# Patient Record
Sex: Female | Born: 1980 | Race: Black or African American | Hispanic: No | Marital: Single | Smoking: Never smoker
Health system: Southern US, Community
[De-identification: ages and names within clinical notes are randomized; demographics above are authoritative.]

## PROBLEM LIST (undated history)

## (undated) DIAGNOSIS — F4310 Post-traumatic stress disorder, unspecified: Secondary | ICD-10-CM

## (undated) DIAGNOSIS — F419 Anxiety disorder, unspecified: Secondary | ICD-10-CM

## (undated) DIAGNOSIS — I10 Essential (primary) hypertension: Secondary | ICD-10-CM

## (undated) DIAGNOSIS — F410 Panic disorder [episodic paroxysmal anxiety] without agoraphobia: Secondary | ICD-10-CM

## (undated) HISTORY — PX: TUBAL LIGATION: SHX77

---

## 2003-02-07 ENCOUNTER — Inpatient Hospital Stay (HOSPITAL_COMMUNITY): Admission: AD | Admit: 2003-02-07 | Discharge: 2003-02-07 | Payer: Self-pay | Admitting: Obstetrics and Gynecology

## 2003-02-18 ENCOUNTER — Inpatient Hospital Stay (HOSPITAL_COMMUNITY): Admission: AD | Admit: 2003-02-18 | Discharge: 2003-02-18 | Payer: Self-pay | Admitting: *Deleted

## 2004-07-21 ENCOUNTER — Inpatient Hospital Stay (HOSPITAL_COMMUNITY): Admission: AD | Admit: 2004-07-21 | Discharge: 2004-07-21 | Payer: Self-pay | Admitting: Family Medicine

## 2004-07-30 ENCOUNTER — Emergency Department (HOSPITAL_COMMUNITY): Admission: EM | Admit: 2004-07-30 | Discharge: 2004-07-30 | Payer: Self-pay | Admitting: Emergency Medicine

## 2004-12-31 ENCOUNTER — Inpatient Hospital Stay (HOSPITAL_COMMUNITY): Admission: AD | Admit: 2004-12-31 | Discharge: 2004-12-31 | Payer: Self-pay | Admitting: *Deleted

## 2006-03-19 ENCOUNTER — Inpatient Hospital Stay (HOSPITAL_COMMUNITY): Admission: AD | Admit: 2006-03-19 | Discharge: 2006-03-19 | Payer: Self-pay | Admitting: Obstetrics and Gynecology

## 2006-03-31 ENCOUNTER — Inpatient Hospital Stay (HOSPITAL_COMMUNITY): Admission: AD | Admit: 2006-03-31 | Discharge: 2006-03-31 | Payer: Self-pay | Admitting: Obstetrics and Gynecology

## 2006-07-30 ENCOUNTER — Inpatient Hospital Stay (HOSPITAL_COMMUNITY): Admission: AD | Admit: 2006-07-30 | Discharge: 2006-07-31 | Payer: Self-pay | Admitting: Family Medicine

## 2006-08-02 ENCOUNTER — Inpatient Hospital Stay (HOSPITAL_COMMUNITY): Admission: AD | Admit: 2006-08-02 | Discharge: 2006-08-02 | Payer: Self-pay | Admitting: Obstetrics and Gynecology

## 2006-08-06 ENCOUNTER — Inpatient Hospital Stay (HOSPITAL_COMMUNITY): Admission: AD | Admit: 2006-08-06 | Discharge: 2006-08-06 | Payer: Self-pay | Admitting: Obstetrics and Gynecology

## 2007-03-26 ENCOUNTER — Inpatient Hospital Stay (HOSPITAL_COMMUNITY): Admission: RE | Admit: 2007-03-26 | Discharge: 2007-03-29 | Payer: Self-pay | Admitting: Obstetrics and Gynecology

## 2007-08-17 IMAGING — US US OB TRANSVAGINAL
1 series · 13 of 28 positions shown · non-contrast
Comparison: 07/30/2006

CLINICAL DATA: Early pregnancy. Viability.

TRANSABDOMINAL AND TRANSVAGINAL PELVIC ULTRASOUND
TECHNIQUE: Both transabdominal and transvaginal ultrasound examinations of the
pelvis were performed including evaluation of the uterus, ovaries, adnexal
regions, and pelvic cul-de-sac.

[Series 1: us ob transvaginal · 0.20mm/px · 29 acquisitions, 13 frames shown]
[im 2/29]
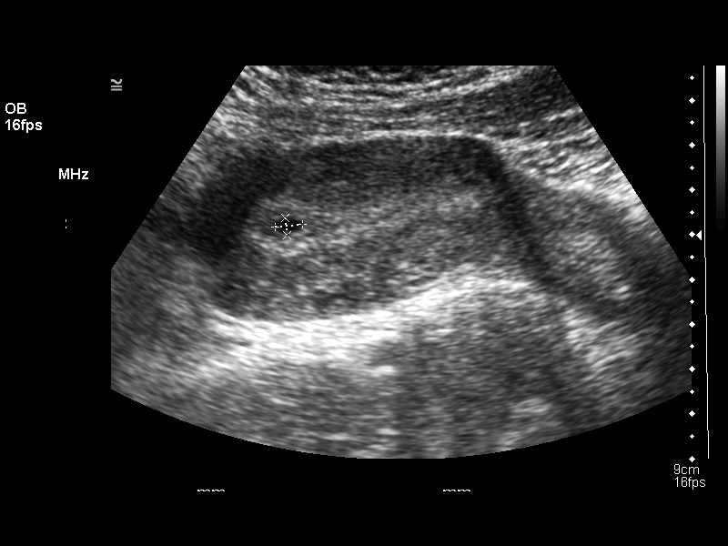
[im 4/29]
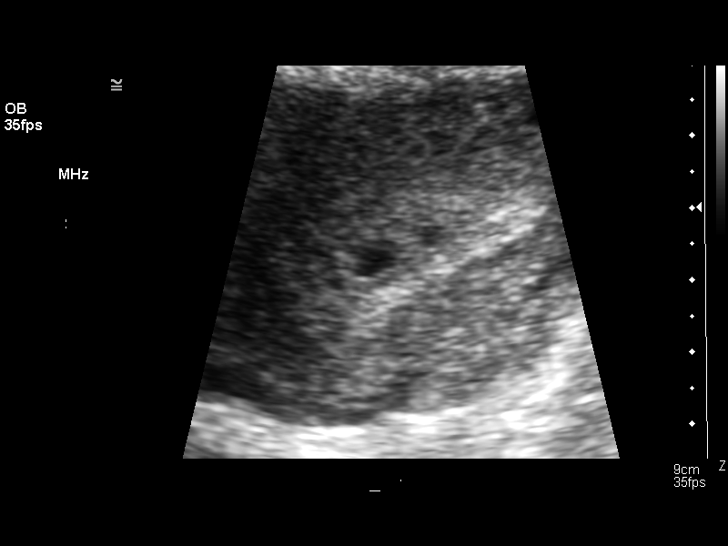
[im 6/29]
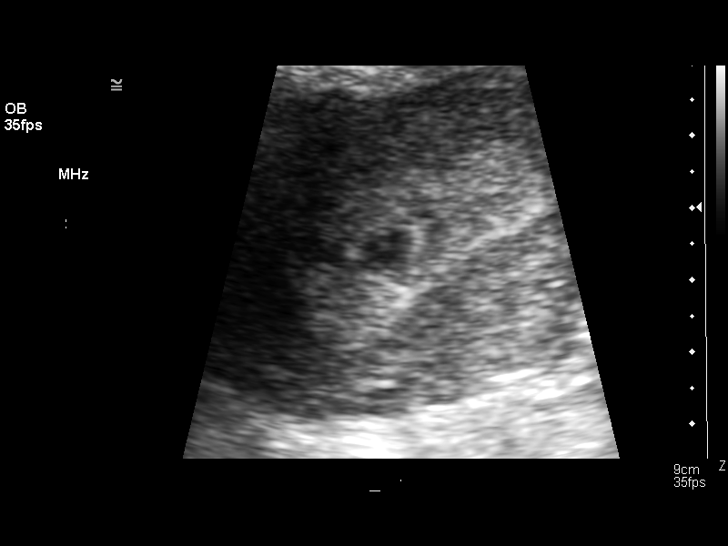
[im 8/29]
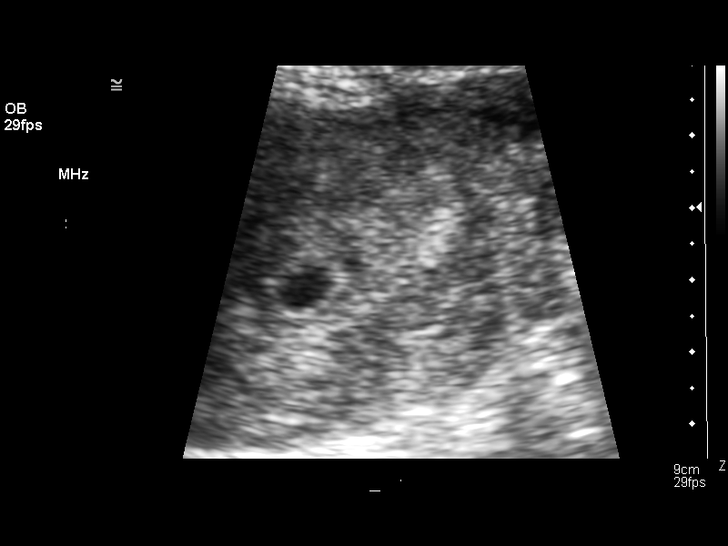
[im 10/29]
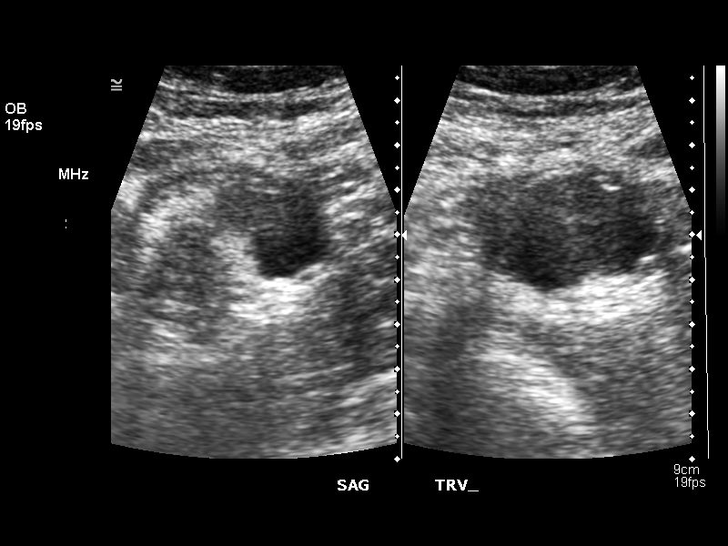
[im 12/29]
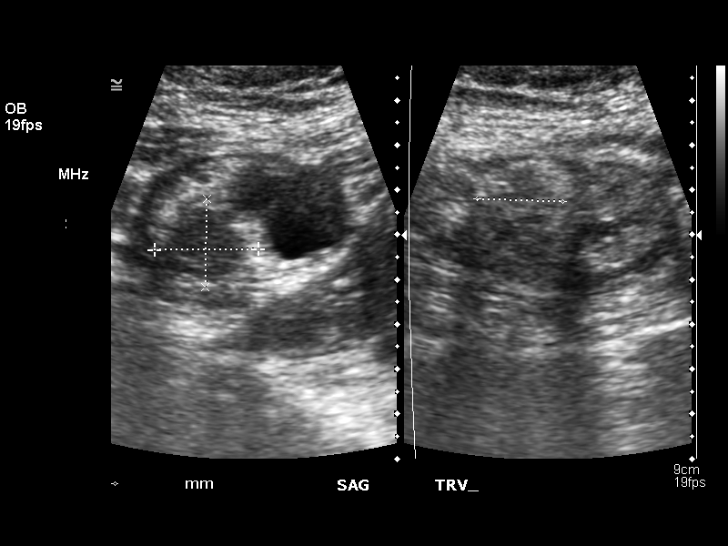
[im 15/29]
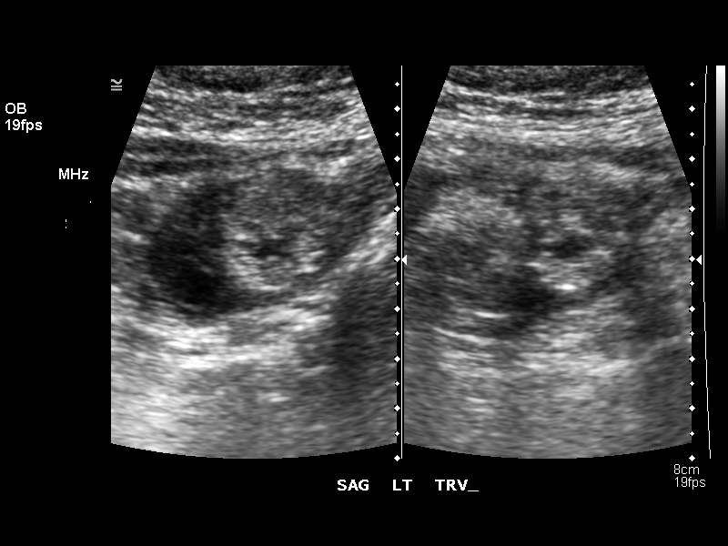
[im 17/29]
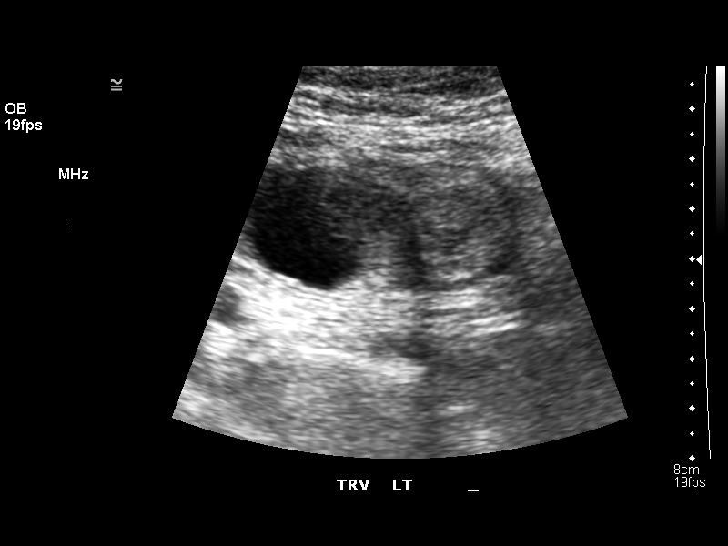
[im 19/29]
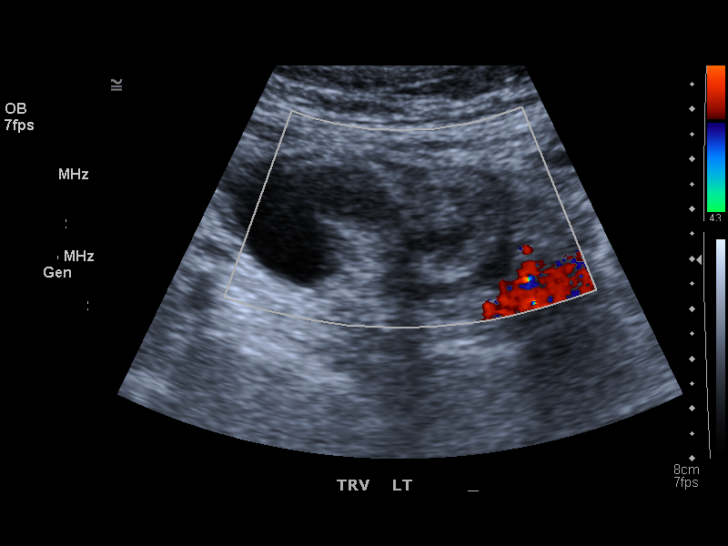
[im 21/29]
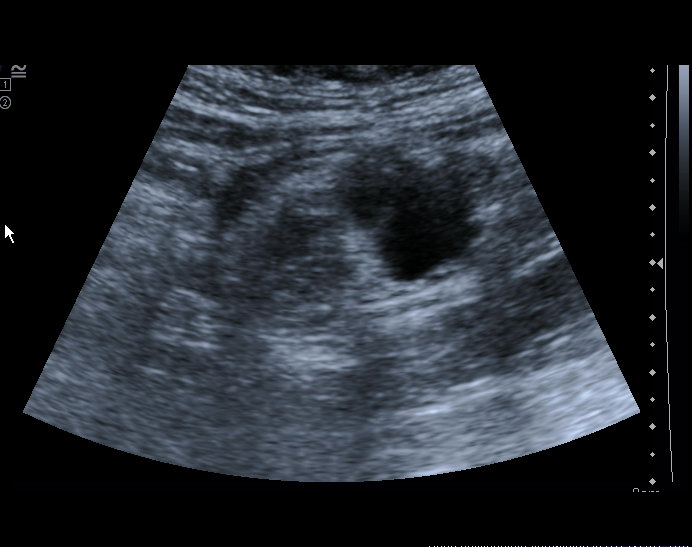
[im 23/29]
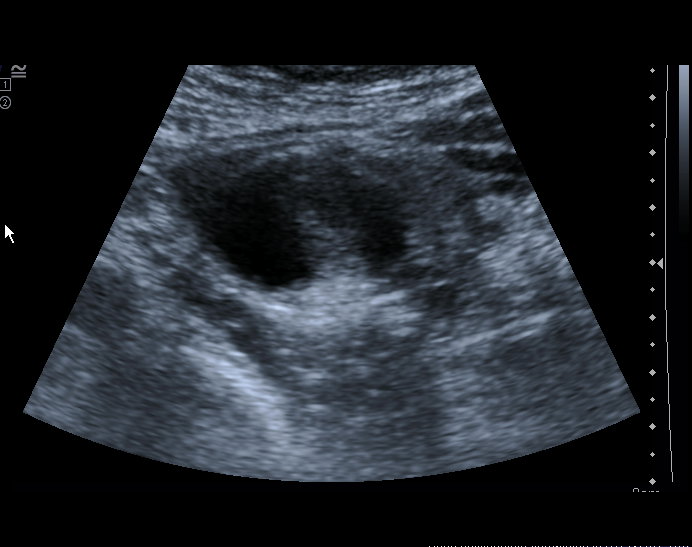
[im 25/29]
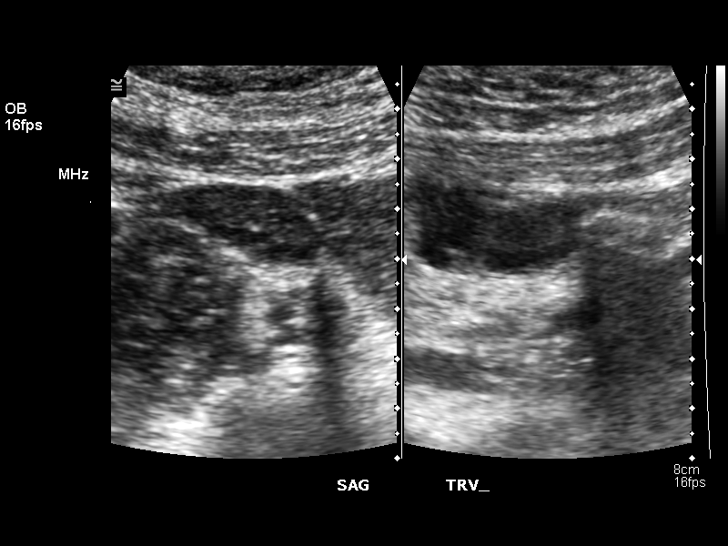
[im 27/29]
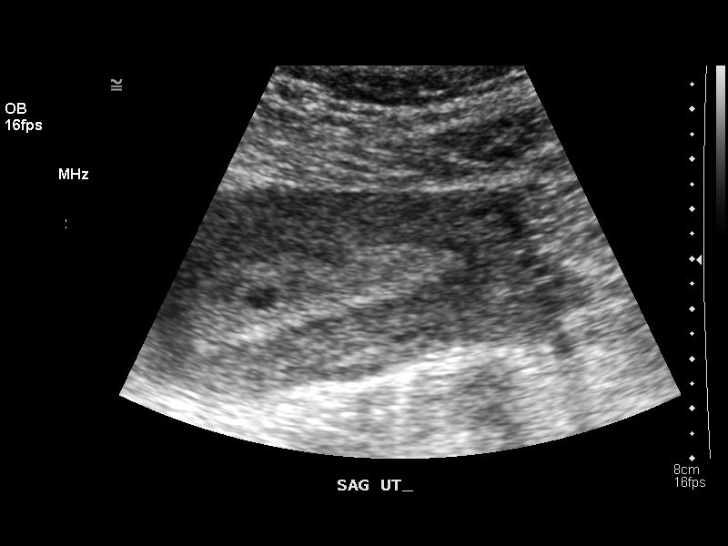

[13 of 28 positions shown; findings below may reference images not displayed]

FINDINGS: Initially transvaginal imaging was employed. This demonstrated a faint
suggestion of a gestational sac in the uterus, but the orientation of the uterus
is unfavorable for transvaginal imaging.

Subsequently, transabdominal imaging was employed. Within the uterine fundal
endometrium there is a 6.1 x 3.9 x 6.2 mm focus of fluid echogenicity, which
would correspond to a 5 week 2 day gestation. However, no embryo or yolk sac is
yet visible. The endometrial stripe appears essentially echogenic, without
definitive double decidual reaction. Thus we cannot be certain that this does
not represent a pseudogestational sac. 

The left ovary measures 4.4 x 4.2 x 3.0 centimeters and contains a 2.1 cm
echogenic region with central hypoechogenicity along its margin. This could
possibly represent a corpus luteum, but could also possibly represent an
echogenic ring of an ectopic pregnancy immediately adjacent to the ovary.  We do
not demonstrate adjacent free pelvic fluid. There is also a small peripheral
ovarian cyst in the left ovary. No definite shadowing calcification is
identified to suggest a dermoid cyst.

The right ovary continues to appear unremarkable, measuring 1.8 x 2.1 x 1.5 cm.
No free pelvic fluid detected.

IMPRESSION

1. There does appear to be a small sac within the uterus, which based on size
would correspond to a 5 week 2 day gestation. However, no yolk sac or embryo is
yet seen, and I cannot definitively exclude a pseudogestational sac,
particularly in light of the echogenic ringlike structure along the left ovary.
This structure may simply represent a corpus luteum, but ectopic pregnancy has
not been definitively excluded. The lack of free pelvic fluid is somewhat
reassuring. I do recommend careful continued followup of quantitative beta-hCG
levels and careful followup imaging to ensure expected intrauterine pregnancy
development.

## 2017-12-05 ENCOUNTER — Other Ambulatory Visit (HOSPITAL_BASED_OUTPATIENT_CLINIC_OR_DEPARTMENT_OTHER): Payer: Self-pay | Admitting: Physician Assistant

## 2017-12-05 DIAGNOSIS — Z1239 Encounter for other screening for malignant neoplasm of breast: Secondary | ICD-10-CM

## 2018-01-09 ENCOUNTER — Other Ambulatory Visit: Payer: Self-pay

## 2018-01-09 ENCOUNTER — Emergency Department (HOSPITAL_BASED_OUTPATIENT_CLINIC_OR_DEPARTMENT_OTHER)
Admission: EM | Admit: 2018-01-09 | Discharge: 2018-01-09 | Disposition: A | Discharge status: Home / Self Care | Payer: Medicaid Other | Attending: Emergency Medicine | Admitting: Emergency Medicine

## 2018-01-09 ENCOUNTER — Encounter (HOSPITAL_BASED_OUTPATIENT_CLINIC_OR_DEPARTMENT_OTHER): Payer: Self-pay | Admitting: Emergency Medicine

## 2018-01-09 DIAGNOSIS — I10 Essential (primary) hypertension: Secondary | ICD-10-CM | POA: Insufficient documentation

## 2018-01-09 DIAGNOSIS — F419 Anxiety disorder, unspecified: Secondary | ICD-10-CM | POA: Diagnosis not present

## 2018-01-09 DIAGNOSIS — Z760 Encounter for issue of repeat prescription: Secondary | ICD-10-CM | POA: Diagnosis present

## 2018-01-09 HISTORY — DX: Essential (primary) hypertension: I10

## 2018-01-09 HISTORY — DX: Anxiety disorder, unspecified: F41.9

## 2018-01-09 MED ORDER — CLONAZEPAM 1 MG PO TABS: 0.5000 mg | ORAL_TABLET | Freq: Two times a day (BID) | ORAL | 0 refills | Status: DC | PRN

## 2018-01-09 NOTE — ED Notes (Signed)
Patient stated that she run out of her prescription and she will be seeing Dr. Julio Sickssei-Bonsu on Monday.  She is concerned because MD wants her to see him every time she needs a refill and she has to pay $35.  While she was in [place], her PCP calls her prescription to the pharmacy without her paying extra money for a visit.

## 2018-01-09 NOTE — ED Provider Notes (Signed)
[facility] EMERGENCY DEPARTMENT Provider Note   CSN: [id number] Arrival date & time: 01/09/18  1258     History   Chief Complaint Chief Complaint  Patient presents with  . Medication Refill    HPI Sharon Hammond is a 37 y.o. female past medical history anxiety and hypertension who presents emergency department today for medication refill.  Patient reports that she takes Klonopin for her anxiety.  Patient reports that she has a primary care doctor who has been prescribing this for her.  Patient reports that she ran out of her medications and called her primary care doctor to get the prescription filled via phone but states that she had to make an appointment.  Patient reports that due to work she was not able to get into see him before she ran out.  Patient comes today because she ran out of her medication last night.  Patient denies any fever, chest pain, difficulty breathing, abdominal pain, nausea/vomiting.  The history is provided by the patient.    Past Medical History:  Diagnosis Date  . Anxiety   . Hypertension     There are no active problems to display for this patient.   Past Surgical History:  Procedure Laterality Date  . TUBAL LIGATION      OB History    No data available       Home Medications    Prior to Admission medications   Medication Sig Start Date End Date Taking? Authorizing Provider  hydrochlorothiazide (MICROZIDE) 12.5 MG capsule Take 12.5 mg by mouth daily.   Yes [provider]  clonazePAM (KLONOPIN) 1 MG tablet Take 0.5 tablets (0.5 mg total) by mouth 2 (two) times daily as needed for up to 3 days for anxiety. 01/09/18 01/12/18  Maxwell CaulLayden, Shanika Levings A, PA-C    Family History No family history on file.  Social History Social History   Tobacco Use  . Smoking status: Never Smoker  . Smokeless tobacco: Never Used  Substance Use Topics  . Alcohol use: Not on file  . Drug use: Not on file     Allergies    Codeine   Review of Systems Review of Systems  Respiratory: Negative for shortness of breath.   Cardiovascular: Negative for chest pain.  Gastrointestinal: Negative for abdominal pain, nausea and vomiting.     Physical Exam Updated Vital Signs BP 133/81 (BP Location: Left Arm)   Pulse 88   Temp 98.3 F (36.8 C) (Oral)   Resp 20   LMP 01/03/2018   SpO2 100%   Physical Exam  Constitutional: She appears well-developed and well-nourished.  HENT:  Head: Normocephalic and atraumatic.  Eyes: Conjunctivae and EOM are normal. Right eye exhibits no discharge. Left eye exhibits no discharge. No scleral icterus.  Cardiovascular: Normal rate and regular rhythm.  Pulmonary/Chest: Effort normal and breath sounds normal.  Neurological: She is alert.  Skin: Skin is warm and dry.  Psychiatric: She has a normal mood and affect. Her speech is normal and behavior is normal.  Nursing note and vitals reviewed.    ED Treatments / Results  Labs (all labs ordered are listed, but only abnormal results are displayed) Labs Reviewed - No data to display  EKG  EKG Interpretation None       Radiology No results found.  Procedures Procedures (including critical care time)  Medications Ordered in ED Medications - No data to display   Initial Impression / Assessment and Plan / ED Course  I have  reviewed the triage vital signs and the nursing notes.  Pertinent labs & imaging results that were available during my care of the patient were reviewed by me and considered in my medical decision making (see chart for details).     37 year old female who presents today for evaluation of medication refill.  Patient takes Klonopin that she is prescribed by her primary care doctor.  Ran out and attempted to have a primary care doctor call in prescription but wanted her to make an appointment.  Patient was unable to see them due to work.  Comes today because she ran out of medications.  No  complaints at this time. Patient is afebrile, non-toxic appearing, sitting comfortably on examination table. Vital signs reviewed and stable.  Patient reviewed on PMP.  She had a Klonopin prescription written on 12/25/17 for 30 Klonopin pills.  Otherwise no other recent Klonopin prescriptions.  I discussed with patient that the ER is not appropriate for chronic medication refills and that she should follow-up with primary care doctor for her management of chronic medications.  We will plan to provide her with 3 days worth of medication until she is able to be seen by primary care doctor but instructed her that her first future medication refill needs should be followed by primary care. Patient had ample opportunity for questions and discussion. All patient's questions were answered with full understanding. Strict return precautions discussed. Patient expresses understanding and agreement to plan.   Final Clinical Impressions(s) / ED Diagnoses   Final diagnoses:  Medication refill    ED Discharge Orders        Ordered    clonazePAM (KLONOPIN) 1 MG tablet  2 times daily PRN     01/09/18 1436       Maxwell Caul, PA-C 01/09/18 1441    Vanetta Mulders, MD 01/10/18 [phone]

## 2018-01-09 NOTE — ED Triage Notes (Signed)
Pt is out of clonazepam 1 mg and was unable to get an appt for over a week. States she was told to come to ED for refill.

## 2018-01-09 NOTE — Discharge Instructions (Signed)
As we discussed, the emergency department cannot refill your chronic prescriptions.  You need a primary care doctor to manage and control your medications.  I have provided a few pills for the next 48 hours until you are able to be seen by primary care doctor.  I provided referrals in the paperwork for you.  Return the emergency department for any chest pain, difficulty breathing, abdominal pain, vomiting, fever or any other worsening or concerning symptoms.

## 2018-06-30 ENCOUNTER — Other Ambulatory Visit: Payer: Self-pay

## 2018-06-30 ENCOUNTER — Encounter (HOSPITAL_BASED_OUTPATIENT_CLINIC_OR_DEPARTMENT_OTHER): Payer: Self-pay | Admitting: *Deleted

## 2018-06-30 ENCOUNTER — Emergency Department (HOSPITAL_BASED_OUTPATIENT_CLINIC_OR_DEPARTMENT_OTHER)
Admission: EM | Admit: 2018-06-30 | Discharge: 2018-06-30 | Disposition: A | Discharge status: Home / Self Care | Payer: 59 | Attending: Emergency Medicine | Admitting: Emergency Medicine

## 2018-06-30 DIAGNOSIS — F4310 Post-traumatic stress disorder, unspecified: Secondary | ICD-10-CM | POA: Diagnosis not present

## 2018-06-30 DIAGNOSIS — I10 Essential (primary) hypertension: Secondary | ICD-10-CM | POA: Insufficient documentation

## 2018-06-30 DIAGNOSIS — Z79899 Other long term (current) drug therapy: Secondary | ICD-10-CM | POA: Insufficient documentation

## 2018-06-30 DIAGNOSIS — F419 Anxiety disorder, unspecified: Secondary | ICD-10-CM | POA: Diagnosis present

## 2018-06-30 HISTORY — DX: Panic disorder (episodic paroxysmal anxiety): F41.0

## 2018-06-30 HISTORY — DX: Post-traumatic stress disorder, unspecified: F43.10

## 2018-06-30 MED ORDER — LORAZEPAM 2 MG/ML IJ SOLN
1.0000 mg | Freq: Once | INTRAMUSCULAR | Status: AC
  Administered 2018-06-30: 1 mg via INTRAMUSCULAR
  Filled 2018-06-30: qty 1

## 2018-06-30 MED ORDER — HYDROXYZINE HCL 25 MG PO TABS: 25.0000 mg | ORAL_TABLET | Freq: Four times a day (QID) | ORAL | 0 refills | Status: DC | PRN

## 2018-06-30 NOTE — ED Triage Notes (Signed)
pt c/o anxiety x 4 days, out of xanax 1 mg tid

## 2018-06-30 NOTE — Discharge Instructions (Signed)
Take Hydroxyzine as directed for anxiety
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
## 2018-07-25 ENCOUNTER — Encounter (HOSPITAL_BASED_OUTPATIENT_CLINIC_OR_DEPARTMENT_OTHER): Payer: Self-pay | Admitting: Emergency Medicine

## 2019-02-06 ENCOUNTER — Encounter (HOSPITAL_BASED_OUTPATIENT_CLINIC_OR_DEPARTMENT_OTHER): Payer: Self-pay | Admitting: Emergency Medicine

## 2019-02-06 ENCOUNTER — Other Ambulatory Visit: Payer: Self-pay

## 2019-02-06 ENCOUNTER — Emergency Department (HOSPITAL_BASED_OUTPATIENT_CLINIC_OR_DEPARTMENT_OTHER)
Admission: EM | Admit: 2019-02-06 | Discharge: 2019-02-06 | Disposition: A | Discharge status: Home / Self Care | Payer: Medicaid Other | Attending: Emergency Medicine | Admitting: Emergency Medicine

## 2019-02-06 DIAGNOSIS — I10 Essential (primary) hypertension: Secondary | ICD-10-CM | POA: Diagnosis not present

## 2019-02-06 DIAGNOSIS — Z79899 Other long term (current) drug therapy: Secondary | ICD-10-CM | POA: Insufficient documentation

## 2019-02-06 DIAGNOSIS — T50905A Adverse effect of unspecified drugs, medicaments and biological substances, initial encounter: Secondary | ICD-10-CM | POA: Diagnosis not present

## 2019-02-06 DIAGNOSIS — F419 Anxiety disorder, unspecified: Secondary | ICD-10-CM | POA: Insufficient documentation

## 2019-02-06 DIAGNOSIS — E876 Hypokalemia: Secondary | ICD-10-CM | POA: Diagnosis not present

## 2019-02-06 DIAGNOSIS — R450 Nervousness: Secondary | ICD-10-CM | POA: Diagnosis present

## 2019-02-06 LAB — COMPREHENSIVE METABOLIC PANEL
ALT: 14 U/L (ref 0–44)
AST: 21 U/L (ref 15–41)
Albumin: 4.2 g/dL (ref 3.5–5.0)
Alkaline Phosphatase: 52 U/L (ref 38–126)
Anion gap: 8 (ref 5–15)
BUN: 7 mg/dL (ref 6–20)
CO2: 23 mmol/L (ref 22–32)
Calcium: 8.8 mg/dL — ABNORMAL LOW (ref 8.9–10.3)
Chloride: 100 mmol/L (ref 98–111)
Creatinine, Ser: 0.71 mg/dL (ref 0.44–1.00)
GFR calc Af Amer: >60 mL/min (ref >60)
GFR calc non Af Amer: >60 mL/min (ref >60)
Glucose, Bld: 91 mg/dL (ref 70–99)
Potassium: 3.1 mmol/L — ABNORMAL LOW (ref 3.5–5.1)
Sodium: 131 mmol/L — ABNORMAL LOW (ref 135–145)
Total Bilirubin: 0.7 mg/dL (ref 0.3–1.2)
Total Protein: 7.7 g/dL (ref 6.5–8.1)

## 2019-02-06 LAB — CBC WITH DIFFERENTIAL/PLATELET
Abs Immature Granulocytes: 0.02 10*3/uL (ref 0.00–0.07)
Basophils Absolute: 0 10*3/uL (ref 0.0–0.1)
Basophils Relative: 1 %
Eosinophils Absolute: 0.1 10*3/uL (ref 0.0–0.5)
Eosinophils Relative: 1 %
HCT: 39.1 % (ref 36.0–46.0)
Hemoglobin: 13 g/dL (ref 12.0–15.0)
Immature Granulocytes: 0 %
Lymphocytes Relative: 34 %
Lymphs Abs: 2.6 10*3/uL (ref 0.7–4.0)
MCH: 29.7 pg (ref 26.0–34.0)
MCHC: 33.2 g/dL (ref 30.0–36.0)
MCV: 89.5 fL (ref 80.0–100.0)
Monocytes Absolute: 0.6 10*3/uL (ref 0.1–1.0)
Monocytes Relative: 7 %
Neutro Abs: 4.4 10*3/uL (ref 1.7–7.7)
Neutrophils Relative %: 57 %
Platelets: 345 10*3/uL (ref 150–400)
RBC: 4.37 MIL/uL (ref 3.87–5.11)
RDW: 13.2 % (ref 11.5–15.5)
WBC: 7.7 10*3/uL (ref 4.0–10.5)
nRBC: 0 % (ref 0.0–0.2)

## 2019-02-06 MED ORDER — POTASSIUM CHLORIDE CRYS ER 20 MEQ PO TBCR
20.0000 meq | EXTENDED_RELEASE_TABLET | Freq: Once | ORAL | Status: AC
  Administered 2019-02-06: 20 meq via ORAL
  Filled 2019-02-06: qty 1

## 2019-02-06 MED ORDER — SODIUM CHLORIDE 0.9 % IV BOLUS
1000.0000 mL | Freq: Once | INTRAVENOUS | Status: AC
  Administered 2019-02-06: 20:00:00 1000 mL via INTRAVENOUS

## 2019-02-06 NOTE — ED Notes (Signed)
ED Provider at bedside. 

## 2019-02-06 NOTE — Discharge Instructions (Signed)
We discussed, follow-up with your primary care doctor.  I would suggest not taking anymore citalopram until you discuss with her.  I provided some additional outpatient counseling resources that you can follow-up with.  Return emergency department for any numbness/weakness of your arms or legs, chest pain, difficulty breathing or any other worsening or concerning symptoms.

## 2019-02-06 NOTE — ED Provider Notes (Signed)
[facility] EMERGENCY DEPARTMENT Provider Note   CSN: [id number] Arrival date & time: 02/06/19  1839    History   Chief Complaint Chief Complaint  Patient presents with  . Medication Reaction    HPI Sharon Hammond is a 38 y.o. female with PMH/o anxiety, HTN, PTSD who presents for evaluation of anxiety, possible medication reaction.  Patient reports that she was recently started on citalopram 20 mg by her primary care doctor.  She reports taking a dose today as well as Xanax, Vistaril.  Patient reports that shortly after, she started feeling very "jittery" and felt like she was having burning sensation in bilateral upper extremities.  Patient states that it started in her right and then spread to left.  She denies any numbness/weakness.  Patient states that she has taken depressive medication before and states she is always felt weird after taking it.  She had never tried citalopram before.  Patient states she has been able to walk without any difficulty.  She denies any chest pain, difficulty breathing, nausea/vomiting, vision change, numbness/weakness of her arms or legs, difficulty speaking, facial droop.  She denies any overlying rash, warmth, erythema.  Patient denies any SI, HI.     The history is provided by the patient.    Past Medical History:  Diagnosis Date  . Anxiety   . Hypertension   . Panic attack   . PTSD (post-traumatic stress disorder)     There are no active problems to display for this patient.   Past Surgical History:  Procedure Laterality Date  . CESAREAN SECTION    . TUBAL LIGATION       OB History   No obstetric history on file.      Home Medications    Prior to Admission medications   Medication Sig Start Date End Date Taking? Authorizing Provider  ALPRAZolam Prudy Feeler) 1 MG tablet Take 1 mg by mouth 3 (three) times daily as needed for anxiety.    [provider]  clonazePAM (KLONOPIN) 1 MG tablet Take 0.5 tablets (0.5 mg  total) by mouth 2 (two) times daily as needed for up to 3 days for anxiety. 01/09/18 01/12/18  Maxwell Caul, PA-C  hydrochlorothiazide (MICROZIDE) 12.5 MG capsule Take 12.5 mg by mouth daily.    [provider]  hydrOXYzine (ATARAX/VISTARIL) 25 MG tablet Take 1 tablet (25 mg total) by mouth every 6 (six) hours as needed for anxiety. 06/30/18   Bethel Born, PA-C    Family History No family history on file.  Social History Social History   Tobacco Use  . Smoking status: Never Smoker  . Smokeless tobacco: Never Used  Substance Use Topics  . Alcohol use: Not Currently  . Drug use: Not Currently     Allergies   Codeine   Review of Systems Review of Systems  Constitutional: Negative for fever.  Eyes: Negative for visual disturbance.  Respiratory: Negative for cough and shortness of breath.   Cardiovascular: Negative for chest pain.  Gastrointestinal: Negative for abdominal pain, nausea and vomiting.  Genitourinary: Negative for dysuria and hematuria.  Neurological: Negative for facial asymmetry, speech difficulty, weakness, numbness and headaches.  Psychiatric/Behavioral: Negative for self-injury. The patient is nervous/anxious.   All other systems reviewed and are negative.    Physical Exam Updated Vital Signs BP 119/72 (BP Location: Right Arm)   Pulse 89   Temp 99.1 F (37.3 C) (Oral)   Resp 17   Ht  (1.473 m)  Wt 68 kg   LMP 01/10/2019   SpO2 99%   BMI 31.35 kg/m   Physical Exam Vitals signs and nursing note reviewed.  Constitutional:      Appearance: Normal appearance. She is well-developed.     Comments: Appears anxious. Intermittently tearful.   HENT:     Head: Normocephalic and atraumatic.  Eyes:     General: Lids are normal.     Conjunctiva/sclera: Conjunctivae normal.     Pupils: Pupils are equal, round, and reactive to light.     Comments: PERRL. EOMs intact without any difficulty.  Neck:     Musculoskeletal: Full passive  range of motion without pain.  Cardiovascular:     Rate and Rhythm: Normal rate and regular rhythm.     Pulses: Normal pulses.          Radial pulses are 2+ on the right side and 2+ on the left side.     Heart sounds: Normal heart sounds. No murmur. No friction rub. No gallop.   Pulmonary:     Effort: Pulmonary effort is normal.     Breath sounds: Normal breath sounds.     Comments: Lungs clear to auscultation bilaterally.  Symmetric chest rise.  No wheezing, rales, rhonchi. Abdominal:     Palpations: Abdomen is soft. Abdomen is not rigid.     Tenderness: There is no abdominal tenderness. There is no guarding.  Musculoskeletal: Normal range of motion.  Skin:    General: Skin is warm and dry.     Capillary Refill: Capillary refill takes less than 2 seconds.     Comments: Good distal cap refill. BUE are not dusky in appearance or cool to touch. No overlying rash.   Neurological:     Mental Status: She is alert and oriented to person, place, and time.     Deep Tendon Reflexes:     Reflex Scores:      Patellar reflexes are 2+ on the right side and 2+ on the left side.    Comments: Cranial nerves III-XII intact Follows commands, Moves all extremities  5/5 strength to BUE and BLE  Sensation intact throughout all major nerve distributions Normal finger to nose. No dysdiadochokinesia. No pronator drift. No gait abnormalities  No slurred speech. No facial droop.   Psychiatric:        Speech: Speech normal.      ED Treatments / Results  Labs (all labs ordered are listed, but only abnormal results are displayed) Labs Reviewed  COMPREHENSIVE METABOLIC PANEL - Abnormal; Notable for the following components:      Result Value   Sodium 131 (*)    Potassium 3.1 (*)    Calcium 8.8 (*)    All other components within normal limits  CBC WITH DIFFERENTIAL/PLATELET    EKG None  Radiology No results found.  Procedures Procedures (including critical care time)  Medications Ordered  in ED Medications  sodium chloride 0.9 % bolus 1,000 mL ( Intravenous Stopped 02/06/19 2103)  potassium chloride SA (K-DUR,KLOR-CON) CR tablet 20 mEq (20 mEq Oral Given 02/06/19 2113)     Initial Impression / Assessment and Plan / ED Course  I have reviewed the triage vital signs and the nursing notes.  Pertinent labs & imaging results that were available during my care of the patient were reviewed by me and considered in my medical decision making (see chart for details).        38 year old female who presents for evaluation of medication reaction,  anxiety, burning sensation to bilateral upper extremities.  She reports taking 20 mg of citalopram as well as Vistaril, Xanax.  Patient states that since then, she has felt "jittery" and felt a burning sensation in her bilateral upper extremities.  No overlying rash, warmth, erythema.  No numbness/weakness. Patient is afebrile, non-toxic appearing, sitting comfortably on examination table. Vital signs reviewed and stable. No neuro deficits noted on exam.  Patient with good refill, pulses.  No overlying rash would be concerning of shingles.  Suspect this most likely related to anxiety.  Given patient's symptoms and distribution of burning sensation, do not suspect CVA.  She exhibits no evidence of numbness/weakness.  She is intermittently tearful throughout and is very anxious.  She denies any SI, HI.  Do not suspect serotonin syndrome.  Will plan check basic labs and reevaluate.  CBC showed no evidence of leukocytosis or anemia.  CMP shows potassium of 3.1.  Will give K-Dur.  Reevaluation.  Patient reports improvement in symptoms.  She appears much more comfortable on my repeat evaluation.  She is resting comfortably on bed and has no evidence of jittery sensation.  She states the burning sensation is completely gone.  Patient with good reflexes, normal strength. At this time, patient exhibits no emergent life-threatening condition that require further  evaluation in ED or admission.  Directed patient to stop taking medication until she is able to follow-up with her primary care doctor.  Will provide patient with outpatient counseling resources. Patient had ample opportunity for questions and discussion. All patient's questions were answered with full understanding. Strict return precautions discussed. Patient expresses understanding and agreement to plan.   Portions of this note were generated with Scientist, clinical (histocompatibility and immunogenetics). Dictation errors may occur despite best attempts at proofreading.   Final Clinical Impressions(s) / ED Diagnoses   Final diagnoses:  Anxiety  Hypokalemia    ED Discharge Orders    None       Rosana Hoes 02/06/19 2117    Melene Plan, DO 02/06/19 2233

## 2019-02-06 NOTE — ED Notes (Signed)
Pt reports starting citalopram yesterday and today feeling like she is racing and very anxious. She reports tingleing to her arms- mostly the Right arm. Pt also reports not being able to get any sleep due to her HCTZ medication and having to urinate often. Pt believes her medications are not helping and needs different ones. Pt reports wanting to stop taking xanax which is why she started the antidepressant.

## 2019-02-06 NOTE — ED Triage Notes (Signed)
Pt started on a new antidepressant yesterday. Now c/o "feeling jittery" and burning to her R arm.

## 2019-02-08 ENCOUNTER — Emergency Department (HOSPITAL_BASED_OUTPATIENT_CLINIC_OR_DEPARTMENT_OTHER)
Admission: EM | Admit: 2019-02-08 | Discharge: 2019-02-08 | Disposition: A | Discharge status: Home / Self Care | Payer: Medicaid Other | Attending: Emergency Medicine | Admitting: Emergency Medicine

## 2019-02-08 ENCOUNTER — Encounter (HOSPITAL_BASED_OUTPATIENT_CLINIC_OR_DEPARTMENT_OTHER): Payer: Self-pay | Admitting: Emergency Medicine

## 2019-02-08 ENCOUNTER — Other Ambulatory Visit: Payer: Self-pay

## 2019-02-08 DIAGNOSIS — F13930 Sedative, hypnotic or anxiolytic use, unspecified with withdrawal, uncomplicated: Secondary | ICD-10-CM | POA: Diagnosis not present

## 2019-02-08 DIAGNOSIS — I10 Essential (primary) hypertension: Secondary | ICD-10-CM | POA: Insufficient documentation

## 2019-02-08 DIAGNOSIS — F419 Anxiety disorder, unspecified: Secondary | ICD-10-CM | POA: Insufficient documentation

## 2019-02-08 DIAGNOSIS — R109 Unspecified abdominal pain: Secondary | ICD-10-CM | POA: Diagnosis present

## 2019-02-08 DIAGNOSIS — F13230 Sedative, hypnotic or anxiolytic dependence with withdrawal, uncomplicated: Secondary | ICD-10-CM

## 2019-02-08 DIAGNOSIS — Z79899 Other long term (current) drug therapy: Secondary | ICD-10-CM | POA: Insufficient documentation

## 2019-02-08 DIAGNOSIS — F4310 Post-traumatic stress disorder, unspecified: Secondary | ICD-10-CM | POA: Diagnosis not present

## 2019-02-08 LAB — CBC WITH DIFFERENTIAL/PLATELET
Abs Immature Granulocytes: 0.01 10*3/uL (ref 0.00–0.07)
Basophils Absolute: 0 10*3/uL (ref 0.0–0.1)
Basophils Relative: 1 %
Eosinophils Absolute: 0 10*3/uL (ref 0.0–0.5)
Eosinophils Relative: 0 %
HCT: 37.8 % (ref 36.0–46.0)
Hemoglobin: 12.6 g/dL (ref 12.0–15.0)
Immature Granulocytes: 0 %
Lymphocytes Relative: 28 %
Lymphs Abs: 1.9 10*3/uL (ref 0.7–4.0)
MCH: 29.8 pg (ref 26.0–34.0)
MCHC: 33.3 g/dL (ref 30.0–36.0)
MCV: 89.4 fL (ref 80.0–100.0)
Monocytes Absolute: 0.5 10*3/uL (ref 0.1–1.0)
Monocytes Relative: 7 %
Neutro Abs: 4.4 10*3/uL (ref 1.7–7.7)
Neutrophils Relative %: 64 %
Platelets: 351 10*3/uL (ref 150–400)
RBC: 4.23 MIL/uL (ref 3.87–5.11)
RDW: 13.3 % (ref 11.5–15.5)
WBC: 6.9 10*3/uL (ref 4.0–10.5)
nRBC: 0 % (ref 0.0–0.2)

## 2019-02-08 LAB — COMPREHENSIVE METABOLIC PANEL
ALT: 14 U/L (ref 0–44)
AST: 23 U/L (ref 15–41)
Albumin: 4.1 g/dL (ref 3.5–5.0)
Alkaline Phosphatase: 45 U/L (ref 38–126)
Anion gap: 8 (ref 5–15)
BUN: 5 mg/dL — ABNORMAL LOW (ref 6–20)
CO2: 24 mmol/L (ref 22–32)
Calcium: 8.6 mg/dL — ABNORMAL LOW (ref 8.9–10.3)
Chloride: 103 mmol/L (ref 98–111)
Creatinine, Ser: 0.67 mg/dL (ref 0.44–1.00)
GFR calc Af Amer: >60 mL/min (ref >60)
GFR calc non Af Amer: >60 mL/min (ref >60)
Glucose, Bld: 89 mg/dL (ref 70–99)
Potassium: 3.3 mmol/L — ABNORMAL LOW (ref 3.5–5.1)
Sodium: 135 mmol/L (ref 135–145)
Total Bilirubin: 0.5 mg/dL (ref 0.3–1.2)
Total Protein: 7.6 g/dL (ref 6.5–8.1)

## 2019-02-08 LAB — LIPASE, BLOOD: Lipase: 29 U/L (ref 11–51)

## 2019-02-08 MED ORDER — LORAZEPAM 2 MG/ML IJ SOLN
0.5000 mg | Freq: Once | INTRAMUSCULAR | Status: AC
  Administered 2019-02-08: 0.5 mg via INTRAVENOUS
  Filled 2019-02-08: qty 1

## 2019-02-08 MED ORDER — LORAZEPAM 0.5 MG PO TABS: 0.5000 mg | ORAL_TABLET | Freq: Three times a day (TID) | ORAL | 0 refills | Status: DC

## 2019-02-08 MED ORDER — SODIUM CHLORIDE 0.9 % IV BOLUS
1000.0000 mL | Freq: Once | INTRAVENOUS | Status: AC
Start: 2019-02-08 — End: 2019-02-08
  Administered 2019-02-08: 1000 mL via INTRAVENOUS

## 2019-02-08 MED FILL — LORazepam 0.5 MG TABS: 0.5 | 3 days supply | Qty: 8 | Fill #0

## 2019-02-08 NOTE — Discharge Instructions (Signed)
Please read and follow all provided instructions.  Your diagnoses today include:  1. Benzodiazepine withdrawal without complication (HCC)     Tests performed today include:  Blood counts and electrolytes  Blood tests to check liver and kidney function  Blood tests to check pancreas function  Vital signs. See below for your results today.   Medications prescribed:   Ativan - medication for anxiety  Take any prescribed medications only as directed.  Home care instructions:   Follow any educational materials contained in this packet.  Follow-up instructions: Please follow-up with your primary care provider in the next 3 days for further evaluation of your symptoms.    Return instructions:  SEEK IMMEDIATE MEDICAL ATTENTION IF:  The pain does not go away or becomes severe   A temperature above 101F develops   Repeated vomiting occurs (multiple episodes)   The pain becomes localized to portions of the abdomen. The right side could possibly be appendicitis. In an adult, the left lower portion of the abdomen could be colitis or diverticulitis.   Blood is being passed in stools or vomit (bright red or black tarry stools)   You develop chest pain, difficulty breathing, dizziness or fainting, or become confused, poorly responsive, or inconsolable (young children)  If you have any other emergent concerns regarding your health  Your vital signs today were: BP 131/86    Pulse 84    Temp 98 F (36.7 C) (Oral)    Resp 17    Ht 4\' 9"  (1.448 m)    Wt 70.3 kg    LMP 02/08/2019 (Approximate)    SpO2 100%    BMI 33.54 kg/m  If your blood pressure (bp) was elevated above 135/85 this visit, please have this repeated by your doctor within one month. --------------

## 2019-02-08 NOTE — ED Notes (Signed)
Pt. Reports she has quit taking xanax and feels like she is going crazy with hot flashes and now nausea and feeling dehydrated.  Pt. In no resp. Distress.  Pt. Talking constantly.

## 2019-02-08 NOTE — ED Provider Notes (Signed)
[facility] EMERGENCY DEPARTMENT Provider Note   CSN: [id number] Arrival date & time: 02/08/19  1458    History   Chief Complaint Chief Complaint  Patient presents with   Abdominal Pain    HPI Sharon Hammond is a 38 y.o. female.     Patient with history of hypertension, panic attack and anxiety, PTSD presents the emergency department today with complaint of anxiety attack.  Patient states that she has been tremulous, having difficulty sleeping, nausea, and diarrhea.  She notes that she discontinued Xanax 2 days ago and symptoms started after this.  She was seen in the emergency department concerned regarding medication reaction from citalopram which she started.  She only took 1 dose of this.  She states that she is not due to restart Xanax until Friday (today is Tuesday).  She has been taking Pepto-Bismol.  Sometimes she gets epigastric pain after eating spicy foods.  No current abdominal pain.  Onset of symptoms acute.  Course is constant.  No history of abdominal surgeries.     Past Medical History:  Diagnosis Date   Anxiety    Hypertension    Panic attack    PTSD (post-traumatic stress disorder)     There are no active problems to display for this patient.   Past Surgical History:  Procedure Laterality Date   CESAREAN SECTION     TUBAL LIGATION       OB History   No obstetric history on file.      Home Medications    Prior to Admission medications   Medication Sig Start Date End Date Taking? Authorizing Provider  ALPRAZolam Prudy Feeler) 1 MG tablet Take 1 mg by mouth 3 (three) times daily as needed for anxiety.    [provider]  clonazePAM (KLONOPIN) 1 MG tablet Take 0.5 tablets (0.5 mg total) by mouth 2 (two) times daily as needed for up to 3 days for anxiety. 01/09/18 01/12/18  Maxwell Caul, PA-C  hydrochlorothiazide (MICROZIDE) 12.5 MG capsule Take 12.5 mg by mouth daily.    [provider]  hydrOXYzine (ATARAX/VISTARIL)  25 MG tablet Take 1 tablet (25 mg total) by mouth every 6 (six) hours as needed for anxiety. 06/30/18   Bethel Born, PA-C    Family History History reviewed. No pertinent family history.  Social History Social History   Tobacco Use   Smoking status: Never Smoker   Smokeless tobacco: Never Used  Substance Use Topics   Alcohol use: Not Currently   Drug use: Not Currently     Allergies   Codeine   Review of Systems Review of Systems  Constitutional: Negative for fever.  HENT: Negative for rhinorrhea and sore throat.   Eyes: Negative for redness.  Respiratory: Positive for shortness of breath. Negative for cough.   Cardiovascular: Negative for chest pain.  Gastrointestinal: Positive for diarrhea and nausea. Negative for abdominal pain and vomiting.  Genitourinary: Negative for dysuria.  Musculoskeletal: Negative for myalgias.  Skin: Negative for rash.  Neurological: Positive for tremors. Negative for headaches.  Psychiatric/Behavioral: The patient is nervous/anxious.      Physical Exam Updated Vital Signs BP (!) 143/95 (BP Location: Right Arm)    Pulse 99    Temp 98 F (36.7 C) (Oral)    Resp 16    Ht 4\' 9"  (1.448 m)    Wt 70.3 kg    LMP 02/08/2019 (Approximate)    SpO2 100%    BMI 33.54 kg/m   Physical Exam Vitals  signs and nursing note reviewed.  Constitutional:      Appearance: She is well-developed. She is diaphoretic.  HENT:     Head: Normocephalic and atraumatic.  Eyes:     General:        Right eye: No discharge.        Left eye: No discharge.     Conjunctiva/sclera: Conjunctivae normal.  Neck:     Musculoskeletal: Normal range of motion and neck supple.  Cardiovascular:     Rate and Rhythm: Normal rate and regular rhythm.     Heart sounds: Normal heart sounds.  Pulmonary:     Effort: Pulmonary effort is normal.     Breath sounds: Normal breath sounds.  Abdominal:     Palpations: Abdomen is soft.     Tenderness: There is no abdominal  tenderness.  Skin:    General: Skin is warm.  Neurological:     Mental Status: She is alert.     GCS: GCS eye subscore is 4. GCS verbal subscore is 5. GCS motor subscore is 6.     Cranial Nerves: Cranial nerves are intact.     Sensory: Sensation is intact.     Motor: Tremor present. No weakness.     Coordination: Coordination is intact.  Psychiatric:        Mood and Affect: Mood is anxious.      ED Treatments / Results  Labs (all labs ordered are listed, but only abnormal results are displayed) Labs Reviewed  COMPREHENSIVE METABOLIC PANEL - Abnormal; Notable for the following components:      Result Value   Potassium 3.3 (*)    BUN 5 (*)    Calcium 8.6 (*)    All other components within normal limits  CBC WITH DIFFERENTIAL/PLATELET  LIPASE, BLOOD    EKG None  Radiology No results found.  Procedures Procedures (including critical care time)  Medications Ordered in ED Medications  sodium chloride 0.9 % bolus 1,000 mL (1,000 mLs Intravenous New Bag/Given 02/08/19 1608)  LORazepam (ATIVAN) injection 0.5 mg (0.5 mg Intravenous Given 02/08/19 1557)     Initial Impression / Assessment and Plan / ED Course  I have reviewed the triage vital signs and the nursing notes.  Pertinent labs & imaging results that were available during my care of the patient were reviewed by me and considered in my medical decision making (see chart for details).        Patient seen and examined. Work-up initiated. Medications ordered.   Vital signs reviewed and are as follows: BP (!) 143/95 (BP Location: Right Arm)    Pulse 99    Temp 98 F (36.7 C) (Oral)    Resp 16    Ht  (1.448 m)    Wt 70.3 kg    LMP 02/08/2019 (Approximate)    SpO2 100%    BMI 33.54 kg/m   Patient with recent abrupt discontinuation of benzodiazepine medication.  She is exhibiting symptoms which are concerning for benzodiazepine withdrawal.  I think this is the root of her abdominal symptoms as well.  Will treat  with fluids, Ativan.  Will check lab work given epigastric pain however abdominal exam is unimpressive at the current time.  5:09 PM symptoms completely improved with IV Ativan.  Patient is back to her baseline and is comfortable.  Labs resulted and are reassuring.  Abdomen remains soft nontender on exam.  She has been hydrated with IV fluids.  She is ready for discharge.  Strongly  encouraged patient to continue discussed benzodiazepine medications prevent withdrawals.  She voices desire to come off the medication and I discussed that she needs to talk with her doctor about this to do this in a safe manner.  She will be filling prescription in 3 days.  She is given #8 tablets 0.5mg  Ativan so that she does not relapse.  The patient was urged to return to the Emergency Department immediately with worsening of current symptoms, worsening abdominal pain, persistent vomiting, blood noted in stools, fever, or any other concerns. The patient verbalized understanding.     Final Clinical Impressions(s) / ED Diagnoses   Final diagnoses:  Benzodiazepine withdrawal without complication (HCC)   Patient with abdominal pain and uncontrolled anxiety related to recent abrupt cessation of benzodiazepine medication.  She was treated in the emergency department today with vast improvement of her symptoms.  Lab work remains reassuring and she has a benign abdominal exam.  No indications for imaging or testing at this point.  Plan with follow-up with her provider as above.  We discussed need for controlled taper when she decides to come off this medication in the future.  She can have this discussion with her doctor.  No suicidal or homicidal ideation.  ED Discharge Orders         Ordered    LORazepam (ATIVAN) 0.5 MG tablet  Every 8 hours     02/08/19 1705           Renne CriglerGeiple, Pranshu Lyster, PA-C 02/08/19 1712    Long, Arlyss RepressJoshua G, MD 02/08/19 1755

## 2019-02-08 NOTE — ED Triage Notes (Signed)
Reports mid epigastric pain x 1 week.  States this worsens after eating spicy food.  Denies vomiting but c/o nausea.  2 episodes of black stool today after taking pepto bismol.  Ambulatory to room in NAD.

## 2019-02-08 NOTE — ED Notes (Signed)
Pt. Was very calm and reassured she felt much better at discharge

## 2019-02-08 NOTE — ED Notes (Signed)
Patient denies pain and is resting comfortably.  

## 2019-07-10 ENCOUNTER — Emergency Department (HOSPITAL_BASED_OUTPATIENT_CLINIC_OR_DEPARTMENT_OTHER): Payer: Medicaid Other

## 2019-07-10 ENCOUNTER — Other Ambulatory Visit: Payer: Self-pay

## 2019-07-10 ENCOUNTER — Encounter (HOSPITAL_BASED_OUTPATIENT_CLINIC_OR_DEPARTMENT_OTHER): Payer: Self-pay | Admitting: *Deleted

## 2019-07-10 ENCOUNTER — Emergency Department (HOSPITAL_BASED_OUTPATIENT_CLINIC_OR_DEPARTMENT_OTHER)
Admission: EM | Admit: 2019-07-10 | Discharge: 2019-07-10 | Disposition: A | Discharge status: Home / Self Care | Payer: Medicaid Other | Attending: Emergency Medicine | Admitting: Emergency Medicine

## 2019-07-10 DIAGNOSIS — Y93I9 Activity, other involving external motion: Secondary | ICD-10-CM | POA: Diagnosis not present

## 2019-07-10 DIAGNOSIS — I10 Essential (primary) hypertension: Secondary | ICD-10-CM | POA: Diagnosis not present

## 2019-07-10 DIAGNOSIS — Y998 Other external cause status: Secondary | ICD-10-CM | POA: Insufficient documentation

## 2019-07-10 DIAGNOSIS — S4991XA Unspecified injury of right shoulder and upper arm, initial encounter: Secondary | ICD-10-CM | POA: Insufficient documentation

## 2019-07-10 DIAGNOSIS — S199XXA Unspecified injury of neck, initial encounter: Secondary | ICD-10-CM | POA: Diagnosis not present

## 2019-07-10 DIAGNOSIS — F419 Anxiety disorder, unspecified: Secondary | ICD-10-CM | POA: Insufficient documentation

## 2019-07-10 DIAGNOSIS — Y92410 Unspecified street and highway as the place of occurrence of the external cause: Secondary | ICD-10-CM | POA: Insufficient documentation

## 2019-07-10 MED ORDER — ACETAMINOPHEN 500 MG PO TABS: 500.0000 mg | ORAL_TABLET | Freq: Four times a day (QID) | ORAL | 0 refills | Status: DC | PRN

## 2019-07-10 MED ORDER — IBUPROFEN 600 MG PO TABS: 600.0000 mg | ORAL_TABLET | Freq: Four times a day (QID) | ORAL | 0 refills | Status: DC | PRN

## 2019-07-10 MED ORDER — METHOCARBAMOL 500 MG PO TABS: 500.0000 mg | ORAL_TABLET | Freq: Two times a day (BID) | ORAL | 0 refills | Status: DC
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
## 2019-07-22 ENCOUNTER — Encounter (HOSPITAL_BASED_OUTPATIENT_CLINIC_OR_DEPARTMENT_OTHER): Payer: Self-pay | Admitting: Emergency Medicine

## 2019-07-22 ENCOUNTER — Emergency Department (HOSPITAL_BASED_OUTPATIENT_CLINIC_OR_DEPARTMENT_OTHER)
Admission: EM | Admit: 2019-07-22 | Discharge: 2019-07-22 | Disposition: A | Discharge status: Home / Self Care | Payer: Medicaid Other | Attending: Emergency Medicine | Admitting: Emergency Medicine

## 2019-07-22 ENCOUNTER — Other Ambulatory Visit: Payer: Self-pay

## 2019-07-22 DIAGNOSIS — Z79899 Other long term (current) drug therapy: Secondary | ICD-10-CM | POA: Insufficient documentation

## 2019-07-22 DIAGNOSIS — I10 Essential (primary) hypertension: Secondary | ICD-10-CM | POA: Diagnosis not present

## 2019-07-22 DIAGNOSIS — R21 Rash and other nonspecific skin eruption: Secondary | ICD-10-CM

## 2019-07-22 MED ORDER — HYDROXYZINE HCL 25 MG PO TABS: 25.0000 mg | ORAL_TABLET | Freq: Every evening | ORAL | 0 refills | Status: DC | PRN

## 2019-07-22 MED ORDER — TRIAMCINOLONE ACETONIDE 0.1 % EX CREA: 1.0000 "application " | TOPICAL_CREAM | Freq: Two times a day (BID) | CUTANEOUS | 0 refills | Status: DC

## 2019-07-22 MED ORDER — PREDNISONE 20 MG PO TABS: ORAL_TABLET | ORAL | 0 refills | Status: DC

## 2019-07-22 MED FILL — predniSONE 20 MG TABS: 20 | 13 days supply | Qty: 20 | Fill #0

## 2019-07-22 MED FILL — hydrOXYzine HCL 25 MG TABS: 25 | 15 days supply | Qty: 15 | Fill #0

## 2019-07-22 MED FILL — TRIAMCINOLONE 0.1% CREAM: 0.1 | 14 days supply | Qty: 30 | Fill #0

## 2019-07-22 NOTE — Discharge Instructions (Signed)
Please read and follow all provided instructions.  Your diagnoses today include:  1. Rash     Tests performed today include:  Vital signs. See below for your results today.   Medications prescribed:   Prednisone - steroid medicine   It is best to take this medication in the morning to prevent sleeping problems. If you are diabetic, monitor your blood sugar closely and stop taking Prednisone if blood sugar is over 300. Take with food to prevent stomach upset.    Hydroxyzine - antihistamine  You can find this medication over-the-counter.   This medication will make you drowsy. DO NOT drive or perform any activities that require you to be awake and alert if taking this.   Triamcinolone (Kenalog) cream - topical steroid medication for skin reaction  Take any prescribed medications only as directed.  Home care instructions:   Follow any educational materials contained in this packet  Use over-the-counter loratadine during the day which is a nonsedating antihistamine  Follow-up instructions: Please follow-up with your primary care provider in the next 3 days for further evaluation of your symptoms.   Return instructions:   Please return to the Emergency Department if you experience worsening symptoms.   Call 9-1-1 immediately if you have an allergic reaction that involves your lips, mouth, throat or if you have any difficulty breathing. This is a life-threatening emergency.   Please return if you have any other emergent concerns.  Additional Information:  Your vital signs today were: BP 126/86 (BP Location: Left Arm)    Pulse 80    Temp 98.5 F (36.9 C)    Resp 20    Ht 4\' 8"  (1.422 m)    Wt 72.6 kg    LMP 07/05/2019    SpO2 99%    BMI 35.87 kg/m  If your blood pressure (BP) was elevated above 135/85 this visit, please have this repeated by your doctor within one month. --------------

## 2019-07-22 NOTE — ED Provider Notes (Signed)
[facility] EMERGENCY DEPARTMENT Provider Note   CSN: [id number] Arrival date & time: 07/22/19  1549     History   Chief Complaint Chief Complaint  Patient presents with  . Rash    HPI Sharon Hammond is a 38 y.o. female.     Patient presents the emergency department with itchy bumps over the past 2 weeks.  Patient has had these on her arms, legs, back.  When they heal they cause a dark area on the skin.  She has had new areas of itching flaring up recently.  No facial swelling, tongue swelling, difficulty breathing or other signs of anaphylaxis.  She has been using topical steroids and oral antihistamines without much improvement.  She states that she lives at home with her kids who have not had any symptoms.  She is not staying in a hotel or a new location.  She states that she recently changed her laundry detergent to Tide 2 weeks ago.  No new foods or medications.     Past Medical History:  Diagnosis Date  . Anxiety   . Hypertension   . Panic attack   . PTSD (post-traumatic stress disorder)     There are no active problems to display for this patient.   Past Surgical History:  Procedure Laterality Date  . CESAREAN SECTION    . TUBAL LIGATION       OB History   No obstetric history on file.      Home Medications    Prior to Admission medications   Medication Sig Start Date End Date Taking? Authorizing Provider  acetaminophen (TYLENOL) 500 MG tablet Take 1 tablet (500 mg total) by mouth every 6 (six) hours as needed. 07/10/19   Law, Waylan BogaAlexandra M, PA-C  ALPRAZolam Prudy Feeler(XANAX) 1 MG tablet Take 1 mg by mouth 3 (three) times daily as needed for anxiety.    [provider]  clonazePAM (KLONOPIN) 1 MG tablet Take 0.5 tablets (0.5 mg total) by mouth 2 (two) times daily as needed for up to 3 days for anxiety. 01/09/18 01/12/18  Maxwell CaulLayden, Lindsey A, PA-C  hydrochlorothiazide (MICROZIDE) 12.5 MG capsule Take 12.5 mg by mouth daily.    [provider]   hydrOXYzine (ATARAX/VISTARIL) 25 MG tablet Take 1 tablet (25 mg total) by mouth at bedtime as needed for itching. 07/22/19   Renne CriglerGeiple, Salbador Fiveash, PA-C  ibuprofen (ADVIL) 600 MG tablet Take 1 tablet (600 mg total) by mouth every 6 (six) hours as needed. 07/10/19   Law, Waylan BogaAlexandra M, PA-C  LORazepam (ATIVAN) 0.5 MG tablet Take 1 tablet (0.5 mg total) by mouth every 8 (eight) hours. 02/08/19   Renne CriglerGeiple, Baley Shands, PA-C  methocarbamol (ROBAXIN) 500 MG tablet Take 1 tablet (500 mg total) by mouth 2 (two) times daily. 07/10/19   Law, Waylan BogaAlexandra M, PA-C  predniSONE (DELTASONE) 20 MG tablet 3 Tabs PO Days 1-3, then 2 tabs PO Days 4-6, then 1 tab PO Day 7-9, then Half Tab PO Day 10-12 07/22/19   Renne CriglerGeiple, Karas Pickerill, PA-C  triamcinolone cream (KENALOG) 0.1 % Apply 1 application topically 2 (two) times daily. 07/22/19   Renne CriglerGeiple, Renae Mottley, PA-C    Family History No family history on file.  Social History Social History   Tobacco Use  . Smoking status: Never Smoker  . Smokeless tobacco: Never Used  Substance Use Topics  . Alcohol use: Not Currently  . Drug use: Not Currently     Allergies   Codeine   Review of Systems Review of Systems  Constitutional: Negative for fever.  HENT: Negative for facial swelling and trouble swallowing.   Eyes: Negative for redness.  Respiratory: Negative for shortness of breath, wheezing and stridor.   Cardiovascular: Negative for chest pain.  Gastrointestinal: Negative for nausea and vomiting.  Musculoskeletal: Negative for myalgias.  Skin: Positive for rash.  Neurological: Negative for light-headedness.  Psychiatric/Behavioral: Negative for confusion.     Physical Exam Updated Vital Signs BP 126/86 (BP Location: Left Arm)   Pulse 80   Temp 98.5 F (36.9 C)   Resp 20   Ht 4\' 8"  (1.422 m)   Wt 72.6 kg   LMP 07/05/2019   SpO2 99%   BMI 35.87 kg/m   Physical Exam Vitals signs and nursing note reviewed.  Constitutional:      Appearance: She is well-developed.  HENT:      Head: Normocephalic and atraumatic.  Eyes:     Conjunctiva/sclera: Conjunctivae normal.  Neck:     Musculoskeletal: Normal range of motion and neck supple.  Pulmonary:     Effort: No respiratory distress.  Skin:    General: Skin is warm and dry.     Comments: Patient with several small papules on her skin of varying ages.  Some are grouped together as on her left wrist.  They involve her upper and lower extremities as well as her upper back and waist.  Neurological:     Mental Status: She is alert.      ED Treatments / Results  Labs (all labs ordered are listed, but only abnormal results are displayed) Labs Reviewed - No data to display  EKG None  Radiology No results found.  Procedures Procedures (including critical care time)  Medications Ordered in ED Medications - No data to display   Initial Impression / Assessment and Plan / ED Course  I have reviewed the triage vital signs and the nursing notes.  Pertinent labs & imaging results that were available during my care of the patient were reviewed by me and considered in my medical decision making (see chart for details).        Patient seen and examined.  Patient with scattered itchy papules consistent with an allergic reaction.  These look like bug bites however patient denies any history suspicious for bedbug exposure.  Certainly she does not have any signs of anaphylaxis.  Will treat symptomatically.  Vital signs reviewed and are as follows: BP 126/86 (BP Location: Left Arm)   Pulse 80   Temp 98.5 F (36.9 C)   Resp 20   Ht 4\' 8"  (1.422 m)   Wt 72.6 kg   LMP 07/05/2019   SpO2 99%   BMI 35.87 kg/m     Final Clinical Impressions(s) / ED Diagnoses   Final diagnoses:  Rash   Patient with nonspecific rash over the past 2 weeks.  No signs of anaphylaxis.  Unclear etiology.  Treatment as above.  No infectious symptoms.   ED Discharge Orders         Ordered    predniSONE (DELTASONE) 20 MG tablet      07/22/19 1636    hydrOXYzine (ATARAX/VISTARIL) 25 MG tablet  At bedtime PRN     07/22/19 1636    triamcinolone cream (KENALOG) 0.1 %  2 times daily     07/22/19 1636           Carlisle Cater, PA-C 07/22/19 1641    Quintella Reichert, MD 07/23/19 1304

## 2019-07-22 NOTE — ED Triage Notes (Signed)
Rash and hives for over 2 weeks. Off and on. Feels like something is biting. No new medication started or new detergents.

## 2019-08-23 ENCOUNTER — Ambulatory Visit: Payer: Self-pay | Admitting: Allergy

## 2019-10-02 ENCOUNTER — Encounter (HOSPITAL_BASED_OUTPATIENT_CLINIC_OR_DEPARTMENT_OTHER): Payer: Self-pay | Admitting: Emergency Medicine

## 2019-10-02 ENCOUNTER — Other Ambulatory Visit: Payer: Self-pay

## 2019-10-02 ENCOUNTER — Emergency Department (HOSPITAL_BASED_OUTPATIENT_CLINIC_OR_DEPARTMENT_OTHER)
Admission: EM | Admit: 2019-10-02 | Discharge: 2019-10-02 | Disposition: A | Discharge status: Home / Self Care | Payer: Medicaid Other | Attending: Emergency Medicine | Admitting: Emergency Medicine

## 2019-10-02 DIAGNOSIS — B009 Herpesviral infection, unspecified: Secondary | ICD-10-CM | POA: Insufficient documentation

## 2019-10-02 DIAGNOSIS — I10 Essential (primary) hypertension: Secondary | ICD-10-CM | POA: Insufficient documentation

## 2019-10-02 DIAGNOSIS — K6289 Other specified diseases of anus and rectum: Secondary | ICD-10-CM | POA: Diagnosis present

## 2019-10-02 DIAGNOSIS — Z79899 Other long term (current) drug therapy: Secondary | ICD-10-CM | POA: Insufficient documentation

## 2019-10-02 MED ORDER — VALACYCLOVIR HCL 500 MG PO TABS: 500.0000 mg | ORAL_TABLET | Freq: Two times a day (BID) | ORAL | 0 refills | Status: DC

## 2019-10-02 MED ORDER — VALACYCLOVIR HCL 500 MG PO TABS: 500.0000 mg | ORAL_TABLET | Freq: Two times a day (BID) | ORAL | 1 refills | Status: DC

## 2019-10-02 NOTE — ED Triage Notes (Signed)
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 The patient reports that she is also having left lower groin pain  - She reports that she is out of her valtrex RX as well

## 2019-10-02 NOTE — Discharge Instructions (Addendum)
Take Valtrex as prescribed twice daily for 3 days.  I have provided one refill for future outbreaks.  Please return to the emergency department if you develop any new or worsening symptoms.  Your medication without insurance and with this discount card is $3.70 at [facility]. Wherever you prefer!
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
## 2020-07-03 ENCOUNTER — Other Ambulatory Visit: Payer: Self-pay

## 2020-07-03 ENCOUNTER — Encounter (HOSPITAL_BASED_OUTPATIENT_CLINIC_OR_DEPARTMENT_OTHER): Payer: Self-pay | Admitting: *Deleted

## 2020-07-03 DIAGNOSIS — F410 Panic disorder [episodic paroxysmal anxiety] without agoraphobia: Secondary | ICD-10-CM | POA: Insufficient documentation

## 2020-07-03 DIAGNOSIS — Z5321 Procedure and treatment not carried out due to patient leaving prior to being seen by health care provider: Secondary | ICD-10-CM | POA: Insufficient documentation

## 2020-07-03 DIAGNOSIS — R002 Palpitations: Secondary | ICD-10-CM | POA: Diagnosis not present

## 2020-07-03 NOTE — ED Triage Notes (Signed)
Pt c/o anxiety x 2 weeks after stopping klonopin. Also started phentermine x 2 weeks, c/o palpitations.

## 2020-07-04 ENCOUNTER — Emergency Department (HOSPITAL_BASED_OUTPATIENT_CLINIC_OR_DEPARTMENT_OTHER)
Admission: EM | Admit: 2020-07-04 | Discharge: 2020-07-04 | Disposition: A | Discharge status: Home / Self Care | Payer: Medicaid Other | Attending: Emergency Medicine | Admitting: Emergency Medicine

## 2020-07-06 ENCOUNTER — Encounter (HOSPITAL_BASED_OUTPATIENT_CLINIC_OR_DEPARTMENT_OTHER): Payer: Self-pay

## 2020-07-06 ENCOUNTER — Other Ambulatory Visit: Payer: Self-pay

## 2020-07-06 ENCOUNTER — Emergency Department (HOSPITAL_BASED_OUTPATIENT_CLINIC_OR_DEPARTMENT_OTHER)
Admission: EM | Admit: 2020-07-06 | Discharge: 2020-07-06 | Disposition: A | Discharge status: Home / Self Care | Payer: Medicaid Other | Attending: Emergency Medicine | Admitting: Emergency Medicine

## 2020-07-06 DIAGNOSIS — Z5321 Procedure and treatment not carried out due to patient leaving prior to being seen by health care provider: Secondary | ICD-10-CM | POA: Insufficient documentation

## 2020-07-06 DIAGNOSIS — Z79899 Other long term (current) drug therapy: Secondary | ICD-10-CM | POA: Insufficient documentation

## 2020-07-06 DIAGNOSIS — R350 Frequency of micturition: Secondary | ICD-10-CM | POA: Diagnosis not present

## 2020-07-06 DIAGNOSIS — F410 Panic disorder [episodic paroxysmal anxiety] without agoraphobia: Secondary | ICD-10-CM | POA: Insufficient documentation

## 2020-07-06 DIAGNOSIS — T50905A Adverse effect of unspecified drugs, medicaments and biological substances, initial encounter: Secondary | ICD-10-CM | POA: Insufficient documentation

## 2020-07-06 DIAGNOSIS — E876 Hypokalemia: Secondary | ICD-10-CM | POA: Insufficient documentation

## 2020-07-06 DIAGNOSIS — R2242 Localized swelling, mass and lump, left lower limb: Secondary | ICD-10-CM | POA: Insufficient documentation

## 2020-07-06 DIAGNOSIS — I10 Essential (primary) hypertension: Secondary | ICD-10-CM | POA: Insufficient documentation

## 2020-07-06 NOTE — ED Triage Notes (Signed)
Pt c/o anxiety and panic attacks. Pt states she has some pressure in her heads, L arm swelling, urinary frequency. Pt is tearful in triage.

## 2020-07-06 NOTE — ED Triage Notes (Addendum)
Pt in triage room appearing anxious with a flight of ideas. Pt stating "I'm having a stroke, I have a blockage, maybe its a heart attack." Pt c/o HA, tremors, nausea, hot flashes. Pt denies CP or ShOB. Pt handed this RN a sample package of topiramate and said she was given this by her PCP yesterday and took one dose. Pt states she has not slept for 1 week and maybe gets a 10 minute nap.

## 2020-07-07 ENCOUNTER — Emergency Department (HOSPITAL_BASED_OUTPATIENT_CLINIC_OR_DEPARTMENT_OTHER)
Admission: EM | Admit: 2020-07-07 | Discharge: 2020-07-07 | Disposition: A | Discharge status: Home / Self Care | Payer: Medicaid Other | Source: Home / Self Care | Attending: Emergency Medicine | Admitting: Emergency Medicine

## 2020-07-07 DIAGNOSIS — T50905A Adverse effect of unspecified drugs, medicaments and biological substances, initial encounter: Secondary | ICD-10-CM

## 2020-07-07 DIAGNOSIS — E876 Hypokalemia: Secondary | ICD-10-CM

## 2020-07-07 LAB — URINALYSIS, ROUTINE W REFLEX MICROSCOPIC
Bilirubin Urine: NEGATIVE
Glucose, UA: NEGATIVE mg/dL
Ketones, ur: NEGATIVE mg/dL
Nitrite: NEGATIVE
Protein, ur: NEGATIVE mg/dL
Specific Gravity, Urine: <1.005 — ABNORMAL LOW (ref 1.005–1.030)
pH: 5 (ref 5.0–8.0)

## 2020-07-07 LAB — BASIC METABOLIC PANEL
Anion gap: 10 (ref 5–15)
BUN: <5 mg/dL — ABNORMAL LOW (ref 6–20)
CO2: 25 mmol/L (ref 22–32)
Calcium: 8.7 mg/dL — ABNORMAL LOW (ref 8.9–10.3)
Chloride: 96 mmol/L — ABNORMAL LOW (ref 98–111)
Creatinine, Ser: 0.73 mg/dL (ref 0.44–1.00)
GFR calc Af Amer: >60 mL/min (ref >60)
GFR calc non Af Amer: >60 mL/min (ref >60)
Glucose, Bld: 116 mg/dL — ABNORMAL HIGH (ref 70–99)
Potassium: 2.9 mmol/L — ABNORMAL LOW (ref 3.5–5.1)
Sodium: 131 mmol/L — ABNORMAL LOW (ref 135–145)

## 2020-07-07 LAB — CBC WITH DIFFERENTIAL/PLATELET
Abs Immature Granulocytes: 0.01 10*3/uL (ref 0.00–0.07)
Basophils Absolute: 0.1 10*3/uL (ref 0.0–0.1)
Basophils Relative: 1 %
Eosinophils Absolute: 0 10*3/uL (ref 0.0–0.5)
Eosinophils Relative: 0 %
HCT: 38.2 % (ref 36.0–46.0)
Hemoglobin: 13.1 g/dL (ref 12.0–15.0)
Immature Granulocytes: 0 %
Lymphocytes Relative: 33 %
Lymphs Abs: 2.2 10*3/uL (ref 0.7–4.0)
MCH: 30.4 pg (ref 26.0–34.0)
MCHC: 34.3 g/dL (ref 30.0–36.0)
MCV: 88.6 fL (ref 80.0–100.0)
Monocytes Absolute: 0.6 10*3/uL (ref 0.1–1.0)
Monocytes Relative: 9 %
Neutro Abs: 3.9 10*3/uL (ref 1.7–7.7)
Neutrophils Relative %: 57 %
Platelets: 360 10*3/uL (ref 150–400)
RBC: 4.31 MIL/uL (ref 3.87–5.11)
RDW: 12.3 % (ref 11.5–15.5)
WBC: 6.8 10*3/uL (ref 4.0–10.5)
nRBC: 0 % (ref 0.0–0.2)

## 2020-07-07 LAB — URINALYSIS, MICROSCOPIC (REFLEX)

## 2020-07-07 LAB — RAPID URINE DRUG SCREEN, HOSP PERFORMED
Amphetamines: NOT DETECTED
Barbiturates: NOT DETECTED
Benzodiazepines: NOT DETECTED
Cocaine: NOT DETECTED
Opiates: NOT DETECTED
Tetrahydrocannabinol: NOT DETECTED

## 2020-07-07 LAB — PREGNANCY, URINE: Preg Test, Ur: NEGATIVE

## 2020-07-07 LAB — ETHANOL: Alcohol, Ethyl (B): <10 mg/dL (ref <10)

## 2020-07-07 MED ORDER — LORAZEPAM 2 MG/ML IJ SOLN
1.0000 mg | Freq: Once | INTRAMUSCULAR | Status: AC
  Administered 2020-07-07: 1 mg via INTRAVENOUS
  Filled 2020-07-07: qty 1

## 2020-07-07 MED ORDER — CLONAZEPAM 0.5 MG PO TABS: 0.5000 mg | ORAL_TABLET | Freq: Two times a day (BID) | ORAL | 0 refills | Status: AC | PRN

## 2020-07-07 MED ORDER — SODIUM CHLORIDE 0.9 % IV BOLUS
1000.0000 mL | Freq: Once | INTRAVENOUS | Status: AC
  Administered 2020-07-07: 1000 mL via INTRAVENOUS

## 2020-07-07 MED ORDER — POTASSIUM CHLORIDE CRYS ER 20 MEQ PO TBCR
40.0000 meq | EXTENDED_RELEASE_TABLET | Freq: Once | ORAL | Status: AC
  Administered 2020-07-07: 40 meq via ORAL
  Filled 2020-07-07: qty 2

## 2020-07-07 MED ORDER — POTASSIUM CHLORIDE 20 MEQ/15ML (10%) PO SOLN
40.0000 meq | Freq: Once | ORAL | Status: AC
  Administered 2020-07-07: 40 meq via ORAL
  Filled 2020-07-07: qty 30
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
## 2020-07-08 LAB — URINE CULTURE: Culture: <10000 — AB

## 2020-07-09 ENCOUNTER — Emergency Department (HOSPITAL_BASED_OUTPATIENT_CLINIC_OR_DEPARTMENT_OTHER)
Admission: EM | Admit: 2020-07-09 | Discharge: 2020-07-09 | Disposition: A | Discharge status: Home / Self Care | Payer: Medicaid Other | Attending: Emergency Medicine | Admitting: Emergency Medicine

## 2020-07-09 ENCOUNTER — Encounter (HOSPITAL_BASED_OUTPATIENT_CLINIC_OR_DEPARTMENT_OTHER): Payer: Self-pay | Admitting: *Deleted

## 2020-07-09 ENCOUNTER — Other Ambulatory Visit: Payer: Self-pay

## 2020-07-09 DIAGNOSIS — I10 Essential (primary) hypertension: Secondary | ICD-10-CM | POA: Insufficient documentation

## 2020-07-09 DIAGNOSIS — Z79899 Other long term (current) drug therapy: Secondary | ICD-10-CM | POA: Insufficient documentation

## 2020-07-09 DIAGNOSIS — Z20822 Contact with and (suspected) exposure to covid-19: Secondary | ICD-10-CM | POA: Insufficient documentation

## 2020-07-09 DIAGNOSIS — R7889 Finding of other specified substances, not normally found in blood: Secondary | ICD-10-CM | POA: Insufficient documentation

## 2020-07-09 DIAGNOSIS — F419 Anxiety disorder, unspecified: Secondary | ICD-10-CM

## 2020-07-09 LAB — COMPREHENSIVE METABOLIC PANEL
ALT: 16 U/L (ref 0–44)
AST: 22 U/L (ref 15–41)
Albumin: 4.2 g/dL (ref 3.5–5.0)
Alkaline Phosphatase: 51 U/L (ref 38–126)
Anion gap: 12 (ref 5–15)
BUN: <5 mg/dL — ABNORMAL LOW (ref 6–20)
CO2: 25 mmol/L (ref 22–32)
Calcium: 9.1 mg/dL (ref 8.9–10.3)
Chloride: 98 mmol/L (ref 98–111)
Creatinine, Ser: 0.65 mg/dL (ref 0.44–1.00)
GFR calc Af Amer: >60 mL/min (ref >60)
GFR calc non Af Amer: >60 mL/min (ref >60)
Glucose, Bld: 94 mg/dL (ref 70–99)
Potassium: 3.6 mmol/L (ref 3.5–5.1)
Sodium: 135 mmol/L (ref 135–145)
Total Bilirubin: 0.5 mg/dL (ref 0.3–1.2)
Total Protein: 7.9 g/dL (ref 6.5–8.1)

## 2020-07-09 LAB — CBC
HCT: 39.7 % (ref 36.0–46.0)
Hemoglobin: 13.2 g/dL (ref 12.0–15.0)
MCH: 29.6 pg (ref 26.0–34.0)
MCHC: 33.2 g/dL (ref 30.0–36.0)
MCV: 89 fL (ref 80.0–100.0)
Platelets: 382 10*3/uL (ref 150–400)
RBC: 4.46 MIL/uL (ref 3.87–5.11)
RDW: 12.6 % (ref 11.5–15.5)
WBC: 6.7 10*3/uL (ref 4.0–10.5)
nRBC: 0 % (ref 0.0–0.2)

## 2020-07-09 LAB — URINALYSIS, ROUTINE W REFLEX MICROSCOPIC
Bilirubin Urine: NEGATIVE
Glucose, UA: NEGATIVE mg/dL
Ketones, ur: NEGATIVE mg/dL
Leukocytes,Ua: NEGATIVE
Nitrite: NEGATIVE
Protein, ur: NEGATIVE mg/dL
Specific Gravity, Urine: 1.02 (ref 1.005–1.030)
pH: 5.5 (ref 5.0–8.0)

## 2020-07-09 LAB — URINALYSIS, MICROSCOPIC (REFLEX): WBC, UA: NONE SEEN WBC/hpf (ref 0–5)

## 2020-07-09 LAB — CBG MONITORING, ED: Glucose-Capillary: 82 mg/dL (ref 70–99)

## 2020-07-09 LAB — SARS CORONAVIRUS 2 BY RT PCR (HOSPITAL ORDER, PERFORMED IN ~~LOC~~ HOSPITAL LAB): SARS Coronavirus 2: NEGATIVE

## 2020-07-09 MED ORDER — ONDANSETRON 4 MG PO TBDP
4.0000 mg | ORAL_TABLET | Freq: Once | ORAL | Status: AC
  Administered 2020-07-09: 4 mg via ORAL
  Filled 2020-07-09: qty 1

## 2020-07-09 NOTE — ED Provider Notes (Signed)
[facility] EMERGENCY DEPARTMENT Provider Note   CSN: [id number] Arrival date & time: 07/09/20  1439     History Chief Complaint  Patient presents with  . Abnormal Lab    Sharon Hammond is a 39 y.o. female who presents emergency department with multiple complaints.  The patient is tangential and difficult to follow but essentially states that she is having marked thirst, weakness, polyuria she has been having this for some time.  She feels dehydrated all the time.  She believes is on her HCTZ.  She says she has been having this since she started taking it 3 years ago.  She says that a year ago her PCP told her the symptoms would go away but they do not seem to be improving and she wants to stop taking her HCTZ.  She complains also of heaviness and swelling in the left arm.  This is new.  She developed stretch marks on the inner arm which are also new.  She does not have it in the right arm.  She denies chest pain or shortness of breath.  She was seen at an urgent care earlier and sent here.  HPI     Past Medical History:  Diagnosis Date  . Anxiety   . Hypertension   . Panic attack   . PTSD (post-traumatic stress disorder)     There are no problems to display for this patient.   Past Surgical History:  Procedure Laterality Date  . CESAREAN SECTION    . TUBAL LIGATION       OB History   No obstetric history on file.     History reviewed. No pertinent family history.  Social History   Tobacco Use  . Smoking status: Never Smoker  . Smokeless tobacco: Never Used  Vaping Use  . Vaping Use: Never used  Substance Use Topics  . Alcohol use: Not Currently  . Drug use: Not Currently    Home Medications Prior to Admission medications   Medication Sig Start Date End Date Taking? Authorizing Provider  clonazePAM (KLONOPIN) 0.5 MG tablet Take 1 tablet (0.5 mg total) by mouth 2 (two) times daily as needed for anxiety. 07/07/20   Molpus, John, MD   hydrochlorothiazide (MICROZIDE) 12.5 MG capsule Take 12.5 mg by mouth daily.    [provider]    Allergies    Codeine  Review of Systems   Review of Systems Ten systems reviewed and are negative for acute change, except as noted in the HPI.   Physical Exam Updated Vital Signs BP (!) 137/96 (BP Location: Left Arm)   Pulse 100   Temp 98.8 F (37.1 C) (Oral)   Resp 20   Ht 4\' 10"  (1.473 m)   Wt 81.6 kg   LMP 07/04/2020   SpO2 100%   BMI 37.62 kg/m   Physical Exam Vitals and nursing note reviewed.  Constitutional:      General: She is not in acute distress.    Appearance: She is well-developed. She is not diaphoretic.  HENT:     Head: Normocephalic and atraumatic.  Eyes:     General: No scleral icterus.    Conjunctiva/sclera: Conjunctivae normal.  Cardiovascular:     Rate and Rhythm: Normal rate and regular rhythm.     Heart sounds: Normal heart sounds. No murmur heard.  No friction rub. No gallop.   Pulmonary:     Effort: Pulmonary effort is normal. No respiratory distress.     Breath sounds: Normal  breath sounds.  Abdominal:     General: Bowel sounds are normal. There is no distension.     Palpations: Abdomen is soft. There is no mass.     Tenderness: There is no abdominal tenderness. There is no guarding.  Musculoskeletal:     Cervical back: Normal range of motion.  Skin:    General: Skin is warm and dry.  Neurological:     Mental Status: She is alert and oriented to person, place, and time.  Psychiatric:        Behavior: Behavior normal.      ED Results / Procedures / Treatments   Labs (all labs ordered are listed, but only abnormal results are displayed) Labs Reviewed  SARS CORONAVIRUS 2 BY RT PCR (HOSPITAL ORDER, PERFORMED IN [facility])  CBC  COMPREHENSIVE METABOLIC PANEL  URINALYSIS, ROUTINE W REFLEX MICROSCOPIC  CBG MONITORING, ED    EKG None  Radiology No results found.  Procedures Procedures (including  critical care time)  Medications Ordered in ED Medications - No data to display  ED Course  I have reviewed the triage vital signs and the nursing notes.  Pertinent labs & imaging results that were available during my care of the patient were reviewed by me and considered in my medical decision making (see chart for details).    MDM Rules/Calculators/A&P                          Patient here with multiple complaints.  Differential includes diabetes, urinary tract infection patient does have swelling of the left upper extremity and I have ordered a follow-up left upper extremity ultrasound tomorrow morning.  .  Mostly think that the patient has anxiety.  Case discussed with remembers working with PA Allena Katz who will assume care of the patient suspect the patient will ultimately be discharged to follow-up with her PCP.  Work-up is pending. Final Clinical Impression(s) / ED Diagnoses Final diagnoses:  None    Rx / DC Orders ED Discharge Orders    None       Arthor Captain, PA-C 07/10/20 1831    Benjiman Core, MD 07/10/20 2230

## 2020-07-09 NOTE — ED Triage Notes (Signed)
Pt reports that she went to [facility] and they told her to come to the ED for 'low blood'. Pt denies having bloodwork done at [facility]. Pt reports all of her 'veins and muscles' are hurting.

## 2020-07-09 NOTE — ED Provider Notes (Addendum)
Care of the patient was assumed from A. Harris PA-C at [facility]; see this provider's note for complete history of present illness, review of systems, and physical exam.  Briefly, the patient is a 39 y.o. female who presented to the ED with multiple complaints, mostly complaining of polydipsia and polyuria.  History significant for anxiety, panic attack, PTSD.   Plan at time of handoff:  Waiting for basic labs, ultrasound tomorrow for left upper extremity due to mild swelling and patient wanting this done.  930 Labs unremarkable, hemoglobin in urine, did speak to patient by this she states that she is on her period right now.  Did speak to patient about why she came here, states that she was anxious and urgent care made her anxious.  States that she is comfortable with going home knowing that her labs are normal.  I did discuss needing to follow-up with your primary care about her HCTZ.  I did discuss that she needs to continue taking this until her PCP tells her not to.  After reassurance, patient states that she feels good with leaving.  Patient is going to come back for her ultrasound tomorrow as discussed with PA Harris.  Patient did not mention to me anything about her left arm swelling.  did answer all questions, strict return precautions given.  Patient to be discharged. The patient is STABLE and is discharged to home in good condition.       Farrel Gordon, PA-C 07/09/20 2214    Benjiman Core, MD 07/10/20 [phone]

## 2020-07-09 NOTE — Discharge Instructions (Addendum)
Your work-up today was reassuring, get rest as we discussed.  Use the anxiety sheets attached.  Please come back to the emergency department for any new or worsening concerning symptoms.  I need you to follow-up with your primary care, if you do not have one please use the list below.  You have a ultrasound scheduled for tomorrow, please come back here tomorrow for this as PA Harris  discussed with you.

## 2020-07-10 ENCOUNTER — Ambulatory Visit (HOSPITAL_BASED_OUTPATIENT_CLINIC_OR_DEPARTMENT_OTHER)
Admission: RE | Admit: 2020-07-10 | Discharge: 2020-07-10 | Disposition: A | Discharge status: Home / Self Care | Payer: Medicaid Other | Source: Ambulatory Visit | Attending: Internal Medicine | Admitting: Internal Medicine

## 2020-07-10 DIAGNOSIS — M7989 Other specified soft tissue disorders: Secondary | ICD-10-CM | POA: Insufficient documentation

## 2020-07-10 NOTE — ED Provider Notes (Signed)
Patient was seen here after having a left upper extremity ultrasound performed today.  There are no abnormalities noted.  Findings were discussed with patient.  All questions were answered.  Encourage patient to follow-up with primary care provider for further evaluation management.   Carroll Sage, PA-C 07/10/20 1221    Terrilee Files, MD 07/10/20 [phone]

## 2020-07-14 ENCOUNTER — Emergency Department (HOSPITAL_BASED_OUTPATIENT_CLINIC_OR_DEPARTMENT_OTHER)
Admission: EM | Admit: 2020-07-14 | Discharge: 2020-07-14 | Disposition: A | Discharge status: Home / Self Care | Payer: Medicaid Other | Attending: Emergency Medicine | Admitting: Emergency Medicine

## 2020-07-14 ENCOUNTER — Other Ambulatory Visit: Payer: Self-pay

## 2020-07-14 ENCOUNTER — Encounter (HOSPITAL_BASED_OUTPATIENT_CLINIC_OR_DEPARTMENT_OTHER): Payer: Self-pay | Admitting: Emergency Medicine

## 2020-07-14 DIAGNOSIS — G4700 Insomnia, unspecified: Secondary | ICD-10-CM | POA: Insufficient documentation

## 2020-07-14 DIAGNOSIS — I10 Essential (primary) hypertension: Secondary | ICD-10-CM | POA: Insufficient documentation

## 2020-07-14 DIAGNOSIS — Z733 Stress, not elsewhere classified: Secondary | ICD-10-CM | POA: Diagnosis not present

## 2020-07-14 DIAGNOSIS — F439 Reaction to severe stress, unspecified: Secondary | ICD-10-CM

## 2020-07-14 NOTE — ED Provider Notes (Signed)
[facility] EMERGENCY DEPARTMENT Provider Note   CSN: [id number] Arrival date & time: 07/14/20  0534     History Chief Complaint  Patient presents with  . Altered Mental Status    Sharon Hammond is a 39 y.o. female.  HPI   39 year old female with insomnia.  This is been ongoing issues for weeks.  She wakes frequently at night.  She feels like she is only able to sleep for about an hour at a time.  She feels tired throughout the day.  This is interfering with her ability to do her work and other activities effectively.  She does have several children.  She reports that she sleeps at night.  Has never been diagnosed with sleep apnea nor had a sleep study.  Past Medical History:  Diagnosis Date  . Anxiety   . Hypertension   . Panic attack   . PTSD (post-traumatic stress disorder)     There are no problems to display for this patient.   Past Surgical History:  Procedure Laterality Date  . CESAREAN SECTION    . TUBAL LIGATION       OB History   No obstetric history on file.     History reviewed. No pertinent family history.  Social History   Tobacco Use  . Smoking status: Never Smoker  . Smokeless tobacco: Never Used  Vaping Use  . Vaping Use: Never used  Substance Use Topics  . Alcohol use: Not Currently  . Drug use: Not Currently    Home Medications Prior to Admission medications   Medication Sig Start Date End Date Taking? Authorizing Provider  clonazePAM (KLONOPIN) 0.5 MG tablet Take 1 tablet (0.5 mg total) by mouth 2 (two) times daily as needed for anxiety. 07/07/20   Molpus, John, MD  hydrochlorothiazide (MICROZIDE) 12.5 MG capsule Take 12.5 mg by mouth daily.    [provider]    Allergies    Codeine and Ibuprofen  Review of Systems   Review of Systems All systems reviewed and negative, other than as noted in HPI.  Physical Exam Updated Vital Signs BP (!) 124/92 (BP Location: Left Arm)   Pulse 91   Temp 98.6 F (37 C)  (Oral)   Resp 20   Ht 4\' 10"  (1.473 m) Comment: Simultaneous filing. User may not have seen previous data.  Wt 81.6 kg Comment: Simultaneous filing. User may not have seen previous data.  LMP 07/04/2020   SpO2 100%   BMI 37.62 kg/m   Physical Exam Vitals and nursing note reviewed.  Constitutional:      General: She is not in acute distress.    Appearance: She is well-developed.  HENT:     Head: Normocephalic and atraumatic.  Eyes:     General:        Right eye: No discharge.        Left eye: No discharge.     Conjunctiva/sclera: Conjunctivae normal.  Cardiovascular:     Rate and Rhythm: Normal rate and regular rhythm.     Heart sounds: Normal heart sounds. No murmur heard.  No friction rub. No gallop.   Pulmonary:     Effort: Pulmonary effort is normal. No respiratory distress.     Breath sounds: Normal breath sounds.  Abdominal:     General: There is no distension.     Palpations: Abdomen is soft.     Tenderness: There is no abdominal tenderness.  Musculoskeletal:        General: No  tenderness.     Cervical back: Neck supple.  Skin:    General: Skin is warm and dry.  Neurological:     Mental Status: She is alert.  Psychiatric:        Behavior: Behavior normal.        Thought Content: Thought content normal.     ED Results / Procedures / Treatments   Labs (all labs ordered are listed, but only abnormal results are displayed) Labs Reviewed - No data to display  EKG None  Radiology No results found.  Procedures Procedures (including critical care time)  Medications Ordered in ED Medications - No data to display  ED Course  I have reviewed the triage vital signs and the nursing notes.  Pertinent labs & imaging results that were available during my care of the patient were reviewed by me and considered in my medical decision making (see chart for details).    MDM Rules/Calculators/A&P                          39 year old female with anxiety and  insomnia.  Denies any SI or HI.  She does report poor sleep with waking frequently and also snoring.  Although I suspect that this is primarily anxiety related, she is obese and I think a sleep study may be worthwhile to pursue.  Needs to discuss this with her PCP.  Also reports history of PTSD.  Encouraged to reestablish care with psychologist/counselor.  I am hesitant to prescribe her any medications with recent problems with other medicines recently in terms of side effects.  Discussed sleep hygiene.  Regular exercise.  Outpatient follow-up.  Final Clinical Impression(s) / ED Diagnoses Final diagnoses:  Insomnia, unspecified type  Stress    Rx / DC Orders ED Discharge Orders    None       Raeford Razor, MD 07/16/20 [phone]

## 2020-07-14 NOTE — ED Triage Notes (Signed)
Patient presents with complaints of inability to sleep x 2 weeks; states she is getting worse with increased weakness and confusion. Patient states she drove herself here. Patient A&O x 4; states "my brain is numb".

## 2020-07-14 NOTE — Discharge Instructions (Addendum)
I am hesitant to prescribe any additional medications at this time, particularly given your intolerance to other medications recently.  Since you are waking up frequently and snore, you may potentially have sleep apnea.  I want you to follow-up with your PCP and discuss obtaining a sleep study.  You are certainly under a lot of stress and have dealt PTSD.  You may benefit from reestablishing care with a counselor.
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
## 2021-08-13 ENCOUNTER — Other Ambulatory Visit (HOSPITAL_BASED_OUTPATIENT_CLINIC_OR_DEPARTMENT_OTHER): Payer: Self-pay

## 2021-08-13 ENCOUNTER — Emergency Department (HOSPITAL_BASED_OUTPATIENT_CLINIC_OR_DEPARTMENT_OTHER)
Admission: EM | Admit: 2021-08-13 | Discharge: 2021-08-13 | Disposition: A | Discharge status: Home / Self Care | Payer: Medicaid Other | Attending: Emergency Medicine | Admitting: Emergency Medicine

## 2021-08-13 ENCOUNTER — Emergency Department (HOSPITAL_BASED_OUTPATIENT_CLINIC_OR_DEPARTMENT_OTHER): Payer: Medicaid Other

## 2021-08-13 ENCOUNTER — Encounter (HOSPITAL_BASED_OUTPATIENT_CLINIC_OR_DEPARTMENT_OTHER): Payer: Self-pay

## 2021-08-13 ENCOUNTER — Other Ambulatory Visit: Payer: Self-pay

## 2021-08-13 DIAGNOSIS — Z79899 Other long term (current) drug therapy: Secondary | ICD-10-CM | POA: Insufficient documentation

## 2021-08-13 DIAGNOSIS — S40021A Contusion of right upper arm, initial encounter: Secondary | ICD-10-CM

## 2021-08-13 DIAGNOSIS — M79601 Pain in right arm: Secondary | ICD-10-CM | POA: Diagnosis not present

## 2021-08-13 DIAGNOSIS — R55 Syncope and collapse: Secondary | ICD-10-CM | POA: Insufficient documentation

## 2021-08-13 DIAGNOSIS — I10 Essential (primary) hypertension: Secondary | ICD-10-CM | POA: Diagnosis not present

## 2021-08-13 LAB — CBC
HCT: 45.4 % (ref 36.0–46.0)
Hemoglobin: 15.4 g/dL — ABNORMAL HIGH (ref 12.0–15.0)
MCH: 30.4 pg (ref 26.0–34.0)
MCHC: 33.9 g/dL (ref 30.0–36.0)
MCV: 89.7 fL (ref 80.0–100.0)
Platelets: 334 10*3/uL (ref 150–400)
RBC: 5.06 MIL/uL (ref 3.87–5.11)
RDW: 12.5 % (ref 11.5–15.5)
WBC: 12.3 10*3/uL — ABNORMAL HIGH (ref 4.0–10.5)
nRBC: 0 % (ref 0.0–0.2)

## 2021-08-13 LAB — BASIC METABOLIC PANEL
Anion gap: 7 (ref 5–15)
BUN: 7 mg/dL (ref 6–20)
CO2: 24 mmol/L (ref 22–32)
Calcium: 7.9 mg/dL — ABNORMAL LOW (ref 8.9–10.3)
Chloride: 101 mmol/L (ref 98–111)
Creatinine, Ser: 0.99 mg/dL (ref 0.44–1.00)
GFR, Estimated: >60 mL/min (ref >60)
Glucose, Bld: 186 mg/dL — ABNORMAL HIGH (ref 70–99)
Potassium: 3.4 mmol/L — ABNORMAL LOW (ref 3.5–5.1)
Sodium: 132 mmol/L — ABNORMAL LOW (ref 135–145)

## 2021-08-13 LAB — D-DIMER, QUANTITATIVE: D-Dimer, Quant: 0.34 ug/mL-FEU (ref 0.00–0.50)

## 2021-08-13 MED ORDER — SODIUM CHLORIDE 0.9 % IV BOLUS
500.0000 mL | Freq: Once | INTRAVENOUS | Status: AC
  Administered 2021-08-13: 500 mL via INTRAVENOUS

## 2021-08-13 MED ORDER — HYDROXYZINE HCL 25 MG PO TABS
25.0000 mg | ORAL_TABLET | Freq: Four times a day (QID) | ORAL | 0 refills | Status: AC
  Filled 2021-08-13: qty 30, 8d supply, fill #0

## 2021-08-13 MED ORDER — OXYCODONE-ACETAMINOPHEN 5-325 MG PO TABS
2.0000 | ORAL_TABLET | Freq: Once | ORAL | Status: AC
Start: 2021-08-13 — End: 2021-08-13
  Administered 2021-08-13: 2 via ORAL
  Filled 2021-08-13: qty 2

## 2021-08-13 NOTE — Discharge Instructions (Signed)
Take Tylenol and Advil as discussed.  Use cool compresses for 15 to 20 minutes every 3 hours.  Follow-up with your primary care doctor towards the end of the week.  Return to the emergency room if your pain significantly worsens, you lose sensation in your right hand, you develop significant weakness in the right hand, when he developed sudden onset of shortness of breath.

## 2021-08-13 NOTE — ED Notes (Signed)
Called by reg clerk with c/o pt feels like she is going to pass out-pt seated in w/c-diaphoretic/alert-states she feels same as when she passes out at plasma center-VSS/hypotensive- taken to tx room 13 via w/c

## 2021-08-13 NOTE — ED Provider Notes (Signed)
[facility] EMERGENCY DEPARTMENT Provider Note   CSN: [id number] Arrival date & time: 08/13/21  1327     History Chief Complaint  Patient presents with   Arm Pain   Near Syncope    Sharon Hammond is a 40 y.o. female.  40 year old female presents today for evaluation of right upper extremity pain, and swelling.  Patient reports she was at plasma donation center donating plasma when she had a syncopal episode and woke up with significant pain in her right upper extremity.  She says she has not been able to move her arm freely since the incident.  She reports prior to passing out she her vision closing in and diaphoresis but denies palpitations, chest pain, shortness of breath, nausea, or other complaints.  She denies a previous history of syncopal episodes.  She does not have a history of seizures.  She reports following the incident a plasma donation was finished in her opposite extremity.  Asides from her right upper extremity pain and swelling she is without complaints.  On exam she is anxious and nervous.  She denies history of blood clots or family history of bleeding disorders.  The history is provided by the patient. No language interpreter was used.      Past Medical History:  Diagnosis Date   Anxiety    Hypertension    Panic attack    PTSD (post-traumatic stress disorder)     There are no problems to display for this patient.   Past Surgical History:  Procedure Laterality Date   CESAREAN SECTION     TUBAL LIGATION       OB History   No obstetric history on file.     No family history on file.  Social History   Tobacco Use   Smoking status: Never   Smokeless tobacco: Never  Vaping Use   Vaping Use: Never used  Substance Use Topics   Alcohol use: Not Currently   Drug use: Not Currently    Home Medications Prior to Admission medications   Medication Sig Start Date End Date Taking? Authorizing Provider  hydrOXYzine (ATARAX/VISTARIL) 25 MG  tablet Take 1 tablet (25 mg total) by mouth every 6 (six) hours for 30 doses. 08/13/21 08/21/21 Yes Francys Bolin, PA-C  clonazePAM (KLONOPIN) 0.5 MG tablet Take 1 tablet (0.5 mg total) by mouth 2 (two) times daily as needed for anxiety. 07/07/20   Molpus, John, MD  hydrochlorothiazide (MICROZIDE) 12.5 MG capsule Take 12.5 mg by mouth daily.    [provider]    Allergies    Codeine and Ibuprofen  Review of Systems   Review of Systems  Constitutional:  Positive for diaphoresis. Negative for activity change, chills and fever.  Eyes:  Positive for visual disturbance.  Respiratory:  Negative for chest tightness and shortness of breath.   Cardiovascular:  Negative for chest pain, palpitations and leg swelling.  Gastrointestinal:  Negative for abdominal pain and nausea.  Musculoskeletal:  Negative for gait problem.  Skin:  Negative for color change.  Neurological:  Positive for syncope. Negative for weakness and light-headedness.  All other systems reviewed and are negative.  Physical Exam Updated Vital Signs BP (!) 131/107   Pulse 86   Temp 98 F (36.7 C)   Resp 18   Ht 4\' 10"  (1.473 m)   Wt 82.6 kg   LMP 08/02/2021   SpO2 100%   BMI 38.04 kg/m   Physical Exam Vitals and nursing note reviewed.  Constitutional:  General: She is not in acute distress.    Appearance: Normal appearance. She is not ill-appearing.  HENT:     Head: Normocephalic and atraumatic.     Nose: Nose normal.  Eyes:     General: No scleral icterus.    Extraocular Movements: Extraocular movements intact.     Conjunctiva/sclera: Conjunctivae normal.  Cardiovascular:     Rate and Rhythm: Normal rate and regular rhythm.     Pulses: Normal pulses.     Heart sounds: Normal heart sounds.  Pulmonary:     Effort: Pulmonary effort is normal. No respiratory distress.     Breath sounds: Normal breath sounds. No wheezing or rales.  Abdominal:     General: There is no distension.     Tenderness: There  is no abdominal tenderness.  Musculoskeletal:        General: Normal range of motion.     Cervical back: Normal range of motion.     Right lower leg: No edema.     Left lower leg: No edema.     Comments: RUE with pain and swelling over the site of plasma IV placement. Radial and ulnar pulses are 2+ and symmetrical. Sensation is intact. ROM in RUE is limited to pain. Passive ROM full but illicits pain on rotational movements of forearm.   Skin:    General: Skin is warm and dry.  Neurological:     General: No focal deficit present.     Mental Status: She is alert. Mental status is at baseline.    ED Results / Procedures / Treatments   Labs (all labs ordered are listed, but only abnormal results are displayed) Labs Reviewed  CBC - Abnormal; Notable for the following components:      Result Value   WBC 12.3 (*)    Hemoglobin 15.4 (*)    All other components within normal limits  BASIC METABOLIC PANEL - Abnormal; Notable for the following components:   Sodium 132 (*)    Potassium 3.4 (*)    Glucose, Bld 186 (*)    Calcium 7.9 (*)    All other components within normal limits  D-DIMER, QUANTITATIVE    EKG None  Radiology No results found.  Procedures Procedures   Medications Ordered in ED Medications  oxyCODONE-acetaminophen (PERCOCET/ROXICET) 5-325 MG per tablet 2 tablet (2 tablets Oral Given 08/13/21 1518)  sodium chloride 0.9 % bolus 500 mL (500 mLs Intravenous New Bag/Given 08/13/21 1558)    ED Course  I have reviewed the triage vital signs and the nursing notes.  Pertinent labs & imaging results that were available during my care of the patient were reviewed by me and considered in my medical decision making (see chart for details).    MDM Rules/Calculators/A&P                           40 year old female presents today for evaluation of syncope and right upper extremity swelling and pain following plasma donation.  She reports prior to syncope she felt her  vision was tunneling and then became diaphoretic.  Without other prodromal symptoms.  EKG unremarkable.  Given presentation do not feel that this is cardiac related.  She is without dyspnea, tachypnea, tachycardia, do not suspect PE in her.  Likely related to volume loss and vasovagal.  She has history of significant anxiety and is anxious and nervous on exam, requesting as needed anxiety medicine to take at home.  CBC with  mild leukocytosis likely reactive given signs or symptoms concerning for acute infection.  D-dimer negative.  No need for ultrasound to evaluate for DVT given negative D-dimer.  Discussed symptomatic treatment with OTC regimen for pain control and cold compresses.  Gust follow-up with PCP at the end of the week.  Discussed strict return precautions.  Final Clinical Impression(s) / ED Diagnoses Final diagnoses:  Right arm pain    Rx / DC Orders ED Discharge Orders          Ordered    hydrOXYzine (ATARAX/VISTARIL) 25 MG tablet  Every 6 hours        08/13/21 1649             Marita Kansas, PA-C 08/13/21 1658    Melene Plan, DO 08/14/21 0703

## 2021-08-13 NOTE — ED Triage Notes (Signed)
Pt c/o pain/swelling to right UE after giving plasma today-states she had IV and BP cuff on right UE during process-states she also passed out during process-NAD-anxious/tearful-to triage in w/c

## 2021-08-23 ENCOUNTER — Telehealth (HOSPITAL_BASED_OUTPATIENT_CLINIC_OR_DEPARTMENT_OTHER): Payer: Self-pay

## 2021-08-23 NOTE — Telephone Encounter (Signed)
Sent requested images via [employer] in [place] [employer] and faxed over requested imaging report to the attention of [employer]

## 2023-11-24 ENCOUNTER — Other Ambulatory Visit (HOSPITAL_BASED_OUTPATIENT_CLINIC_OR_DEPARTMENT_OTHER): Payer: Self-pay
